# Patient Record
Sex: Male | Born: 1965 | Race: White | Hispanic: No | Smoking: Current every day smoker
Health system: Southern US, Community
[De-identification: ages and names within clinical notes are randomized; demographics above are authoritative.]

## PROBLEM LIST (undated history)

## (undated) DIAGNOSIS — Z950 Presence of cardiac pacemaker: Secondary | ICD-10-CM

## (undated) DIAGNOSIS — K861 Other chronic pancreatitis: Secondary | ICD-10-CM

## (undated) DIAGNOSIS — K7460 Unspecified cirrhosis of liver: Secondary | ICD-10-CM

---

## 2008-11-05 ENCOUNTER — Emergency Department (HOSPITAL_COMMUNITY): Admission: EM | Admit: 2008-11-05 | Discharge: 2008-11-06 | Payer: Self-pay | Admitting: Emergency Medicine

## 2008-12-08 ENCOUNTER — Emergency Department (HOSPITAL_COMMUNITY): Admission: EM | Admit: 2008-12-08 | Discharge: 2008-12-09 | Payer: Self-pay | Admitting: Emergency Medicine

## 2009-01-15 ENCOUNTER — Emergency Department (HOSPITAL_COMMUNITY): Admission: EM | Admit: 2009-01-15 | Discharge: 2009-01-15 | Payer: Self-pay | Admitting: Emergency Medicine

## 2009-01-17 ENCOUNTER — Inpatient Hospital Stay (HOSPITAL_COMMUNITY): Admission: EM | Admit: 2009-01-17 | Discharge: 2009-01-18 | Payer: Self-pay | Admitting: Emergency Medicine

## 2009-01-25 ENCOUNTER — Inpatient Hospital Stay (HOSPITAL_COMMUNITY): Admission: EM | Admit: 2009-01-25 | Discharge: 2009-01-30 | Payer: Self-pay | Admitting: Emergency Medicine

## 2009-01-26 ENCOUNTER — Ambulatory Visit: Payer: Self-pay | Admitting: Internal Medicine

## 2009-02-01 ENCOUNTER — Inpatient Hospital Stay (HOSPITAL_COMMUNITY): Admission: EM | Admit: 2009-02-01 | Discharge: 2009-02-03 | Payer: Self-pay | Admitting: Emergency Medicine

## 2009-03-01 ENCOUNTER — Encounter (INDEPENDENT_AMBULATORY_CARE_PROVIDER_SITE_OTHER): Payer: Self-pay | Admitting: Internal Medicine

## 2009-03-01 ENCOUNTER — Ambulatory Visit: Payer: Self-pay | Admitting: Family Medicine

## 2009-03-01 LAB — CONVERTED CEMR LAB
Albumin: 4.3 g/dL (ref 3.5–5.2)
BUN: 13 mg/dL (ref 6–23)
Basophils Absolute: 0.1 10*3/uL (ref 0.0–0.1)
Calcium: 8.6 mg/dL (ref 8.4–10.5)
Chloride: 108 meq/L (ref 96–112)
Creatinine, Ser: 0.99 mg/dL (ref 0.40–1.50)
Eosinophils Absolute: 1.4 10*3/uL — ABNORMAL HIGH (ref 0.0–0.7)
Eosinophils Relative: 16 % — ABNORMAL HIGH (ref 0–5)
Glucose, Bld: 88 mg/dL (ref 70–99)
HCT: 50.3 % (ref 39.0–52.0)
Hemoglobin: 17.1 g/dL — ABNORMAL HIGH (ref 13.0–17.0)
Lymphocytes Relative: 36 % (ref 12–46)
Lymphs Abs: 3.2 10*3/uL (ref 0.7–4.0)
MCV: 94.4 fL (ref 78.0–100.0)
Monocytes Absolute: 0.7 10*3/uL (ref 0.1–1.0)
Platelets: 129 10*3/uL — ABNORMAL LOW (ref 150–400)
Potassium: 4 meq/L (ref 3.5–5.3)
RDW: 14.9 % (ref 11.5–15.5)

## 2009-03-12 ENCOUNTER — Emergency Department (HOSPITAL_COMMUNITY): Admission: EM | Admit: 2009-03-12 | Discharge: 2009-03-13 | Payer: Self-pay | Admitting: Emergency Medicine

## 2009-03-20 ENCOUNTER — Inpatient Hospital Stay (HOSPITAL_COMMUNITY): Admission: EM | Admit: 2009-03-20 | Discharge: 2009-03-22 | Payer: Self-pay | Admitting: Emergency Medicine

## 2009-03-24 ENCOUNTER — Emergency Department (HOSPITAL_COMMUNITY): Admission: EM | Admit: 2009-03-24 | Discharge: 2009-03-24 | Payer: Self-pay | Admitting: Emergency Medicine

## 2009-03-31 ENCOUNTER — Emergency Department (HOSPITAL_COMMUNITY): Admission: EM | Admit: 2009-03-31 | Discharge: 2009-03-31 | Payer: Self-pay | Admitting: Emergency Medicine

## 2009-04-23 ENCOUNTER — Emergency Department (HOSPITAL_COMMUNITY): Admission: EM | Admit: 2009-04-23 | Discharge: 2009-04-23 | Payer: Self-pay | Admitting: Emergency Medicine

## 2009-05-03 ENCOUNTER — Ambulatory Visit: Payer: Self-pay | Admitting: Internal Medicine

## 2009-05-03 ENCOUNTER — Observation Stay (HOSPITAL_COMMUNITY): Admission: EM | Admit: 2009-05-03 | Discharge: 2009-05-03 | Payer: Self-pay | Admitting: Emergency Medicine

## 2009-05-08 ENCOUNTER — Emergency Department (HOSPITAL_COMMUNITY): Admission: EM | Admit: 2009-05-08 | Discharge: 2009-05-08 | Payer: Self-pay | Admitting: Emergency Medicine

## 2009-07-15 ENCOUNTER — Emergency Department (HOSPITAL_COMMUNITY): Admission: EM | Admit: 2009-07-15 | Discharge: 2009-07-15 | Payer: Self-pay | Admitting: Emergency Medicine

## 2010-01-23 ENCOUNTER — Emergency Department (HOSPITAL_COMMUNITY): Admission: EM | Admit: 2010-01-23 | Discharge: 2010-01-23 | Payer: Self-pay | Admitting: Emergency Medicine

## 2010-06-03 LAB — CBC
HCT: 42.7 % (ref 39.0–52.0)
MCH: 32.1 pg (ref 26.0–34.0)
MCV: 93.2 fL (ref 78.0–100.0)
Platelets: 71 10*3/uL — ABNORMAL LOW (ref 150–400)
RBC: 4.58 MIL/uL (ref 4.22–5.81)
WBC: 5.8 10*3/uL (ref 4.0–10.5)

## 2010-06-03 LAB — LIPASE, BLOOD: Lipase: 30 U/L (ref 11–59)

## 2010-06-03 LAB — POCT I-STAT, CHEM 8
Calcium, Ion: 1.12 mmol/L (ref 1.12–1.32)
Glucose, Bld: 101 mg/dL — ABNORMAL HIGH (ref 70–99)
HCT: 47 % (ref 39.0–52.0)
TCO2: 24 mmol/L (ref 0–100)

## 2010-06-03 LAB — COMPREHENSIVE METABOLIC PANEL
Albumin: 3.8 g/dL (ref 3.5–5.2)
Alkaline Phosphatase: 81 U/L (ref 39–117)
BUN: 10 mg/dL (ref 6–23)
Chloride: 107 mEq/L (ref 96–112)
Creatinine, Ser: 0.99 mg/dL (ref 0.4–1.5)
GFR calc non Af Amer: >60 mL/min (ref >60)
Glucose, Bld: 102 mg/dL — ABNORMAL HIGH (ref 70–99)
Potassium: 3.7 mEq/L (ref 3.5–5.1)
Total Bilirubin: 0.9 mg/dL (ref 0.3–1.2)

## 2010-06-03 LAB — DIFFERENTIAL
Basophils Absolute: 0 10*3/uL (ref 0.0–0.1)
Basophils Relative: 1 % (ref 0–1)
Lymphocytes Relative: 38 % (ref 12–46)
Monocytes Absolute: 0.4 10*3/uL (ref 0.1–1.0)
Neutro Abs: 2.7 10*3/uL (ref 1.7–7.7)
Neutrophils Relative %: 47 % (ref 43–77)

## 2010-06-03 LAB — URINALYSIS, ROUTINE W REFLEX MICROSCOPIC
Ketones, ur: NEGATIVE mg/dL
Leukocytes, UA: NEGATIVE
Protein, ur: NEGATIVE mg/dL
Urobilinogen, UA: 1 mg/dL (ref 0.0–1.0)

## 2010-06-03 LAB — URINE MICROSCOPIC-ADD ON

## 2010-06-10 LAB — POCT I-STAT, CHEM 8
Creatinine, Ser: 0.7 mg/dL (ref 0.4–1.5)
Hemoglobin: 18.4 g/dL — ABNORMAL HIGH (ref 13.0–17.0)
Potassium: 3.8 mEq/L (ref 3.5–5.1)
Sodium: 142 mEq/L (ref 135–145)
TCO2: 23 mmol/L (ref 0–100)

## 2010-06-10 LAB — DIFFERENTIAL
Basophils Absolute: 0 10*3/uL (ref 0.0–0.1)
Eosinophils Absolute: 0.3 10*3/uL (ref 0.0–0.7)
Eosinophils Relative: 4 % (ref 0–5)
Lymphocytes Relative: 25 % (ref 12–46)
Lymphs Abs: 1.7 10*3/uL (ref 0.7–4.0)
Neutrophils Relative %: 64 % (ref 43–77)

## 2010-06-10 LAB — POCT CARDIAC MARKERS
CKMB, poc: <1 ng/mL — ABNORMAL LOW (ref 1.0–8.0)
Myoglobin, poc: 38 ng/mL (ref 12–200)

## 2010-06-10 LAB — CBC
HCT: 47.2 % (ref 39.0–52.0)
MCV: 95.9 fL (ref 78.0–100.0)
Platelets: 110 10*3/uL — ABNORMAL LOW (ref 150–400)
RDW: 14.4 % (ref 11.5–15.5)
WBC: 6.8 10*3/uL (ref 4.0–10.5)

## 2010-06-11 LAB — RAPID URINE DRUG SCREEN, HOSP PERFORMED
Opiates: NOT DETECTED
Tetrahydrocannabinol: NOT DETECTED

## 2010-06-11 LAB — CBC
MCHC: 34.6 g/dL (ref 30.0–36.0)
Platelets: 109 10*3/uL — ABNORMAL LOW (ref 150–400)
RDW: 13.6 % (ref 11.5–15.5)

## 2010-06-11 LAB — BASIC METABOLIC PANEL
BUN: 13 mg/dL (ref 6–23)
CO2: 23 mEq/L (ref 19–32)
Calcium: 9.9 mg/dL (ref 8.4–10.5)
Creatinine, Ser: 0.9 mg/dL (ref 0.4–1.5)
GFR calc non Af Amer: >60 mL/min (ref >60)
Glucose, Bld: 88 mg/dL (ref 70–99)

## 2010-06-11 LAB — URINALYSIS, ROUTINE W REFLEX MICROSCOPIC
Bilirubin Urine: NEGATIVE
Glucose, UA: NEGATIVE mg/dL
Nitrite: NEGATIVE
Specific Gravity, Urine: 1.014 (ref 1.005–1.030)
pH: 6 (ref 5.0–8.0)

## 2010-06-11 LAB — HEPATITIS C VIRUS, RIBA
HCV C22P Band: 4
Hepatitis C-RIBA II; NS5 Band: 4

## 2010-06-11 LAB — C-REACTIVE PROTEIN: CRP: 0 mg/dL — ABNORMAL LOW (ref <0.6)

## 2010-06-11 LAB — SEDIMENTATION RATE: Sed Rate: 1 mm/hr (ref 0–16)

## 2010-06-11 LAB — DIFFERENTIAL
Basophils Absolute: 0 10*3/uL (ref 0.0–0.1)
Basophils Relative: 1 % (ref 0–1)
Lymphocytes Relative: 43 % (ref 12–46)
Monocytes Absolute: 0.6 10*3/uL (ref 0.1–1.0)
Neutro Abs: 2.4 10*3/uL (ref 1.7–7.7)
Neutrophils Relative %: 37 % — ABNORMAL LOW (ref 43–77)

## 2010-06-11 LAB — LACTIC ACID, PLASMA: Lactic Acid, Venous: 1.3 mmol/L (ref 0.5–2.2)

## 2010-06-11 LAB — HIV ANTIBODY (ROUTINE TESTING W REFLEX): HIV: NONREACTIVE

## 2010-06-11 LAB — HEPATIC FUNCTION PANEL
Alkaline Phosphatase: 70 U/L (ref 39–117)
Indirect Bilirubin: 0.6 mg/dL (ref 0.3–0.9)
Total Bilirubin: 0.9 mg/dL (ref 0.3–1.2)
Total Protein: 6 g/dL (ref 6.0–8.3)

## 2010-06-11 LAB — HEPATITIS B CORE ANTIBODY, IGM: Hep B C IgM: NEGATIVE

## 2010-06-23 LAB — DIFFERENTIAL
Basophils Relative: 1 % (ref 0–1)
Basophils Relative: 1 % (ref 0–1)
Eosinophils Absolute: 0.5 10*3/uL (ref 0.0–0.7)
Eosinophils Absolute: 0.7 10*3/uL (ref 0.0–0.7)
Eosinophils Relative: 10 % — ABNORMAL HIGH (ref 0–5)
Eosinophils Relative: 9 % — ABNORMAL HIGH (ref 0–5)
Lymphocytes Relative: 27 % (ref 12–46)
Lymphocytes Relative: 35 % (ref 12–46)
Lymphs Abs: 1.2 10*3/uL (ref 0.7–4.0)
Lymphs Abs: 1.8 10*3/uL (ref 0.7–4.0)
Lymphs Abs: 2 10*3/uL (ref 0.7–4.0)
Lymphs Abs: 3.4 10*3/uL (ref 0.7–4.0)
Monocytes Absolute: 0.5 10*3/uL (ref 0.1–1.0)
Monocytes Relative: 10 % (ref 3–12)
Monocytes Relative: 9 % (ref 3–12)
Neutro Abs: 2.2 10*3/uL (ref 1.7–7.7)
Neutro Abs: 3.3 10*3/uL (ref 1.7–7.7)
Neutrophils Relative %: 40 % — ABNORMAL LOW (ref 43–77)
Neutrophils Relative %: 50 % (ref 43–77)
Neutrophils Relative %: 50 % (ref 43–77)

## 2010-06-23 LAB — CBC
HCT: 43 % (ref 39.0–52.0)
HCT: 43.3 % (ref 39.0–52.0)
HCT: 48 % (ref 39.0–52.0)
Hemoglobin: 14.8 g/dL (ref 13.0–17.0)
Hemoglobin: 14.9 g/dL (ref 13.0–17.0)
Hemoglobin: 16.1 g/dL (ref 13.0–17.0)
Hemoglobin: 16.6 g/dL (ref 13.0–17.0)
MCHC: 34 g/dL (ref 30.0–36.0)
MCHC: 34.2 g/dL (ref 30.0–36.0)
MCHC: 34.3 g/dL (ref 30.0–36.0)
Platelets: 101 10*3/uL — ABNORMAL LOW (ref 150–400)
Platelets: 129 10*3/uL — ABNORMAL LOW (ref 150–400)
Platelets: 88 10*3/uL — ABNORMAL LOW (ref 150–400)
RBC: 4.51 MIL/uL (ref 4.22–5.81)
RBC: 4.92 MIL/uL (ref 4.22–5.81)
RBC: 5 MIL/uL (ref 4.22–5.81)
RBC: 5.21 MIL/uL (ref 4.22–5.81)
RDW: 14.2 % (ref 11.5–15.5)
WBC: 5.5 10*3/uL (ref 4.0–10.5)
WBC: 5.8 10*3/uL (ref 4.0–10.5)
WBC: 6.4 10*3/uL (ref 4.0–10.5)
WBC: 8.4 10*3/uL (ref 4.0–10.5)

## 2010-06-23 LAB — COMPREHENSIVE METABOLIC PANEL
ALT: 209 U/L — ABNORMAL HIGH (ref 0–53)
AST: 118 U/L — ABNORMAL HIGH (ref 0–37)
Albumin: 3.5 g/dL (ref 3.5–5.2)
Albumin: 4 g/dL (ref 3.5–5.2)
Alkaline Phosphatase: 75 U/L (ref 39–117)
Alkaline Phosphatase: 79 U/L (ref 39–117)
BUN: 11 mg/dL (ref 6–23)
BUN: 9 mg/dL (ref 6–23)
CO2: 23 mEq/L (ref 19–32)
CO2: 27 mEq/L (ref 19–32)
Calcium: 8.6 mg/dL (ref 8.4–10.5)
Calcium: 8.7 mg/dL (ref 8.4–10.5)
Chloride: 102 mEq/L (ref 96–112)
Chloride: 109 mEq/L (ref 96–112)
GFR calc Af Amer: >60 mL/min (ref >60)
GFR calc non Af Amer: >60 mL/min (ref >60)
Glucose, Bld: 102 mg/dL — ABNORMAL HIGH (ref 70–99)
Glucose, Bld: 103 mg/dL — ABNORMAL HIGH (ref 70–99)
Potassium: 3.7 mEq/L (ref 3.5–5.1)
Potassium: 3.8 mEq/L (ref 3.5–5.1)
Sodium: 139 mEq/L (ref 135–145)
Total Bilirubin: 0.8 mg/dL (ref 0.3–1.2)
Total Protein: 6 g/dL (ref 6.0–8.3)

## 2010-06-23 LAB — ETHANOL
Alcohol, Ethyl (B): <5 mg/dL (ref 0–10)
Alcohol, Ethyl (B): <5 mg/dL (ref 0–10)

## 2010-06-23 LAB — DRUGS OF ABUSE SCREEN W/O ALC, ROUTINE URINE
Amphetamine Screen, Ur: NEGATIVE
Benzodiazepines.: NEGATIVE
Creatinine,U: 134.6 mg/dL
Marijuana Metabolite: NEGATIVE
Opiate Screen, Urine: NEGATIVE
Propoxyphene: NEGATIVE

## 2010-06-23 LAB — BASIC METABOLIC PANEL
GFR calc non Af Amer: >60 mL/min (ref >60)
Potassium: 3.6 mEq/L (ref 3.5–5.1)
Sodium: 135 mEq/L (ref 135–145)

## 2010-06-23 LAB — PHOSPHORUS: Phosphorus: 4.7 mg/dL — ABNORMAL HIGH (ref 2.3–4.6)

## 2010-06-23 LAB — POCT I-STAT, CHEM 8
Chloride: 108 mEq/L (ref 96–112)
Glucose, Bld: 92 mg/dL (ref 70–99)
HCT: 55 % — ABNORMAL HIGH (ref 39.0–52.0)
Hemoglobin: 18.7 g/dL — ABNORMAL HIGH (ref 13.0–17.0)
Potassium: 3.8 mEq/L (ref 3.5–5.1)
Sodium: 142 mEq/L (ref 135–145)

## 2010-06-23 LAB — POCT CARDIAC MARKERS
CKMB, poc: <1 ng/mL — ABNORMAL LOW (ref 1.0–8.0)
Myoglobin, poc: 77.4 ng/mL (ref 12–200)

## 2010-06-23 LAB — RAPID URINE DRUG SCREEN, HOSP PERFORMED
Amphetamines: POSITIVE — AB
Barbiturates: NOT DETECTED
Benzodiazepines: NOT DETECTED

## 2010-06-23 LAB — CULTURE, BLOOD (ROUTINE X 2)
Culture: NO GROWTH
Culture: NO GROWTH

## 2010-06-23 LAB — HEPATIC FUNCTION PANEL
AST: 121 U/L — ABNORMAL HIGH (ref 0–37)
Albumin: 3.8 g/dL (ref 3.5–5.2)

## 2010-06-23 LAB — APTT: aPTT: 30 seconds (ref 24–37)

## 2010-06-23 LAB — MAGNESIUM: Magnesium: 2 mg/dL (ref 1.5–2.5)

## 2010-06-25 LAB — COMPREHENSIVE METABOLIC PANEL
ALT: 320 U/L — ABNORMAL HIGH (ref 0–53)
AST: 128 U/L — ABNORMAL HIGH (ref 0–37)
AST: 151 U/L — ABNORMAL HIGH (ref 0–37)
Albumin: 3.4 g/dL — ABNORMAL LOW (ref 3.5–5.2)
Albumin: 3.9 g/dL (ref 3.5–5.2)
Albumin: 4.1 g/dL (ref 3.5–5.2)
BUN: 11 mg/dL (ref 6–23)
BUN: 12 mg/dL (ref 6–23)
BUN: 6 mg/dL (ref 6–23)
CO2: 23 mEq/L (ref 19–32)
CO2: 27 mEq/L (ref 19–32)
CO2: 28 mEq/L (ref 19–32)
Calcium: 9.1 mg/dL (ref 8.4–10.5)
Calcium: 9.2 mg/dL (ref 8.4–10.5)
Chloride: 102 mEq/L (ref 96–112)
Chloride: 109 mEq/L (ref 96–112)
Creatinine, Ser: 1.04 mg/dL (ref 0.4–1.5)
Creatinine, Ser: 1.06 mg/dL (ref 0.4–1.5)
Creatinine, Ser: 1.16 mg/dL (ref 0.4–1.5)
GFR calc Af Amer: >60 mL/min (ref >60)
GFR calc Af Amer: >60 mL/min (ref >60)
GFR calc Af Amer: >60 mL/min (ref >60)
GFR calc non Af Amer: >60 mL/min (ref >60)
GFR calc non Af Amer: >60 mL/min (ref >60)
GFR calc non Af Amer: >60 mL/min (ref >60)
Glucose, Bld: 103 mg/dL — ABNORMAL HIGH (ref 70–99)
Potassium: 3.5 mEq/L (ref 3.5–5.1)
Sodium: 137 mEq/L (ref 135–145)
Total Bilirubin: 0.6 mg/dL (ref 0.3–1.2)
Total Bilirubin: 1 mg/dL (ref 0.3–1.2)
Total Protein: 6.3 g/dL (ref 6.0–8.3)
Total Protein: 7.3 g/dL (ref 6.0–8.3)

## 2010-06-25 LAB — BASIC METABOLIC PANEL
BUN: 13 mg/dL (ref 6–23)
BUN: 8 mg/dL (ref 6–23)
CO2: 30 mEq/L (ref 19–32)
Calcium: 8.8 mg/dL (ref 8.4–10.5)
Creatinine, Ser: 1.22 mg/dL (ref 0.4–1.5)
GFR calc non Af Amer: >60 mL/min (ref >60)
Glucose, Bld: 80 mg/dL (ref 70–99)
Potassium: 4 mEq/L (ref 3.5–5.1)
Sodium: 138 mEq/L (ref 135–145)

## 2010-06-25 LAB — CBC
HCT: 42.1 % (ref 39.0–52.0)
HCT: 44.2 % (ref 39.0–52.0)
MCHC: 33.7 g/dL (ref 30.0–36.0)
MCHC: 34.2 g/dL (ref 30.0–36.0)
MCHC: 34.4 g/dL (ref 30.0–36.0)
MCHC: 34.8 g/dL (ref 30.0–36.0)
MCHC: 34.9 g/dL (ref 30.0–36.0)
MCV: 95.8 fL (ref 78.0–100.0)
MCV: 96.6 fL (ref 78.0–100.0)
MCV: 97.7 fL (ref 78.0–100.0)
Platelets: 121 10*3/uL — ABNORMAL LOW (ref 150–400)
Platelets: 124 10*3/uL — ABNORMAL LOW (ref 150–400)
Platelets: 140 10*3/uL — ABNORMAL LOW (ref 150–400)
RBC: 4.57 MIL/uL (ref 4.22–5.81)
RBC: 4.71 MIL/uL (ref 4.22–5.81)
RBC: 4.75 MIL/uL (ref 4.22–5.81)
RDW: 14.2 % (ref 11.5–15.5)
WBC: 5 10*3/uL (ref 4.0–10.5)
WBC: 5.4 10*3/uL (ref 4.0–10.5)
WBC: 5.6 10*3/uL (ref 4.0–10.5)
WBC: 8.7 10*3/uL (ref 4.0–10.5)

## 2010-06-25 LAB — DIFFERENTIAL
Eosinophils Absolute: 0.4 10*3/uL (ref 0.0–0.7)
Eosinophils Relative: 11 % — ABNORMAL HIGH (ref 0–5)
Eosinophils Relative: 3 % (ref 0–5)
Lymphocytes Relative: 45 % (ref 12–46)
Lymphs Abs: 2.4 10*3/uL (ref 0.7–4.0)
Monocytes Absolute: 0.5 10*3/uL (ref 0.1–1.0)
Monocytes Absolute: 0.9 10*3/uL (ref 0.1–1.0)
Monocytes Relative: 10 % (ref 3–12)
Monocytes Relative: 8 % (ref 3–12)
Neutro Abs: 1.8 10*3/uL (ref 1.7–7.7)

## 2010-06-25 LAB — URINE CULTURE
Colony Count: NO GROWTH
Culture: NO GROWTH

## 2010-06-25 LAB — URINALYSIS, ROUTINE W REFLEX MICROSCOPIC
Glucose, UA: NEGATIVE mg/dL
Hgb urine dipstick: NEGATIVE
Ketones, ur: NEGATIVE mg/dL
Nitrite: NEGATIVE
Protein, ur: NEGATIVE mg/dL
Protein, ur: NEGATIVE mg/dL
Urobilinogen, UA: >8 mg/dL — ABNORMAL HIGH (ref 0.0–1.0)
pH: 6.5 (ref 5.0–8.0)

## 2010-06-25 LAB — CULTURE, BLOOD (ROUTINE X 2)
Culture: NO GROWTH
Culture: NO GROWTH

## 2010-06-25 LAB — HEPATITIS PANEL, ACUTE
HCV Ab: REACTIVE — AB
Hepatitis B Surface Ag: NEGATIVE

## 2010-06-25 LAB — URINALYSIS, MICROSCOPIC ONLY
Bilirubin Urine: NEGATIVE
Hgb urine dipstick: NEGATIVE
Ketones, ur: NEGATIVE mg/dL
Nitrite: NEGATIVE
Urobilinogen, UA: 4 mg/dL — ABNORMAL HIGH (ref 0.0–1.0)

## 2010-06-25 LAB — URINE MICROSCOPIC-ADD ON

## 2010-06-25 LAB — LEGIONELLA ANTIGEN, URINE

## 2010-06-25 LAB — RAPID URINE DRUG SCREEN, HOSP PERFORMED
Amphetamines: NOT DETECTED
Opiates: POSITIVE — AB
Tetrahydrocannabinol: POSITIVE — AB

## 2010-06-25 LAB — HEPATITIS C ANTIBODY: HCV Ab: REACTIVE — AB

## 2010-06-25 LAB — POCT CARDIAC MARKERS
CKMB, poc: 1.5 ng/mL (ref 1.0–8.0)
Myoglobin, poc: 80.5 ng/mL (ref 12–200)
Troponin i, poc: <0.05 ng/mL (ref 0.00–0.09)

## 2010-06-25 LAB — GLUCOSE, CAPILLARY
Glucose-Capillary: 118 mg/dL — ABNORMAL HIGH (ref 70–99)
Glucose-Capillary: 97 mg/dL (ref 70–99)

## 2010-06-25 LAB — HEPATITIS B SURFACE ANTIBODY,QUALITATIVE: Hep B S Ab: POSITIVE — AB

## 2010-06-26 LAB — HEPATIC FUNCTION PANEL
ALT: 461 U/L — ABNORMAL HIGH (ref 0–53)
Albumin: 3.8 g/dL (ref 3.5–5.2)
Alkaline Phosphatase: 88 U/L (ref 39–117)
Indirect Bilirubin: 0.9 mg/dL (ref 0.3–0.9)
Total Protein: 6.3 g/dL (ref 6.0–8.3)

## 2010-06-26 LAB — CBC
HCT: 41.8 % (ref 39.0–52.0)
HCT: 48.4 % (ref 39.0–52.0)
Hemoglobin: 15.9 g/dL (ref 13.0–17.0)
Hemoglobin: 16.5 g/dL (ref 13.0–17.0)
Platelets: 96 10*3/uL — ABNORMAL LOW (ref 150–400)
RBC: 4.8 MIL/uL (ref 4.22–5.81)
RBC: 5.02 MIL/uL (ref 4.22–5.81)
RDW: 14.4 % (ref 11.5–15.5)
RDW: 14.7 % (ref 11.5–15.5)
WBC: 6.6 10*3/uL (ref 4.0–10.5)

## 2010-06-26 LAB — COMPREHENSIVE METABOLIC PANEL
ALT: 505 U/L — ABNORMAL HIGH (ref 0–53)
ALT: 514 U/L — ABNORMAL HIGH (ref 0–53)
Albumin: 3.6 g/dL (ref 3.5–5.2)
Alkaline Phosphatase: 77 U/L (ref 39–117)
Alkaline Phosphatase: 87 U/L (ref 39–117)
Alkaline Phosphatase: 97 U/L (ref 39–117)
BUN: 8 mg/dL (ref 6–23)
BUN: 8 mg/dL (ref 6–23)
CO2: 28 mEq/L (ref 19–32)
CO2: 29 mEq/L (ref 19–32)
Chloride: 106 mEq/L (ref 96–112)
Chloride: 108 mEq/L (ref 96–112)
GFR calc non Af Amer: >60 mL/min (ref >60)
Glucose, Bld: 83 mg/dL (ref 70–99)
Glucose, Bld: 98 mg/dL (ref 70–99)
Potassium: 3.7 mEq/L (ref 3.5–5.1)
Potassium: 3.9 mEq/L (ref 3.5–5.1)
Potassium: 4.2 mEq/L (ref 3.5–5.1)
Sodium: 141 mEq/L (ref 135–145)
Sodium: 141 mEq/L (ref 135–145)
Total Bilirubin: 1 mg/dL (ref 0.3–1.2)
Total Bilirubin: 1.3 mg/dL — ABNORMAL HIGH (ref 0.3–1.2)
Total Protein: 5.9 g/dL — ABNORMAL LOW (ref 6.0–8.3)
Total Protein: 7.3 g/dL (ref 6.0–8.3)

## 2010-06-26 LAB — LACTIC ACID, PLASMA: Lactic Acid, Venous: 0.7 mmol/L (ref 0.5–2.2)

## 2010-06-26 LAB — DIFFERENTIAL
Basophils Absolute: 0 10*3/uL (ref 0.0–0.1)
Basophils Absolute: 0 10*3/uL (ref 0.0–0.1)
Basophils Relative: 1 % (ref 0–1)
Basophils Relative: 1 % (ref 0–1)
Eosinophils Absolute: 0.5 10*3/uL (ref 0.0–0.7)
Eosinophils Absolute: 0.5 10*3/uL (ref 0.0–0.7)
Monocytes Relative: 10 % (ref 3–12)
Monocytes Relative: 7 % (ref 3–12)
Neutrophils Relative %: 40 % — ABNORMAL LOW (ref 43–77)
Neutrophils Relative %: 42 % — ABNORMAL LOW (ref 43–77)

## 2010-06-26 LAB — URINALYSIS, ROUTINE W REFLEX MICROSCOPIC
Glucose, UA: NEGATIVE mg/dL
Hgb urine dipstick: NEGATIVE
Ketones, ur: NEGATIVE mg/dL
Protein, ur: NEGATIVE mg/dL
Urobilinogen, UA: 2 mg/dL — ABNORMAL HIGH (ref 0.0–1.0)

## 2010-06-26 LAB — URINE CULTURE: Culture: NO GROWTH

## 2010-06-26 LAB — LIPASE, BLOOD: Lipase: 45 U/L (ref 11–59)

## 2010-06-27 LAB — LIPASE, BLOOD: Lipase: 175 U/L — ABNORMAL HIGH (ref 11–59)

## 2010-06-27 LAB — CBC
HCT: 49.3 % (ref 39.0–52.0)
MCHC: 34.5 g/dL (ref 30.0–36.0)
MCV: 95 fL (ref 78.0–100.0)
Platelets: 133 10*3/uL — ABNORMAL LOW (ref 150–400)
RDW: 14.1 % (ref 11.5–15.5)

## 2010-06-27 LAB — COMPREHENSIVE METABOLIC PANEL
Albumin: 4 g/dL (ref 3.5–5.2)
BUN: 13 mg/dL (ref 6–23)
Creatinine, Ser: 1.01 mg/dL (ref 0.4–1.5)
Total Protein: 6.8 g/dL (ref 6.0–8.3)

## 2010-06-27 LAB — DIFFERENTIAL
Eosinophils Relative: 7 % — ABNORMAL HIGH (ref 0–5)
Lymphocytes Relative: 42 % (ref 12–46)
Lymphs Abs: 2.6 10*3/uL (ref 0.7–4.0)

## 2010-06-28 LAB — URINALYSIS, ROUTINE W REFLEX MICROSCOPIC
Ketones, ur: NEGATIVE mg/dL
Protein, ur: NEGATIVE mg/dL
Urobilinogen, UA: 1 mg/dL (ref 0.0–1.0)

## 2010-06-28 LAB — DIFFERENTIAL
Basophils Absolute: 0.1 10*3/uL (ref 0.0–0.1)
Basophils Relative: 1 % (ref 0–1)
Neutro Abs: 3.1 10*3/uL (ref 1.7–7.7)
Neutrophils Relative %: 45 % (ref 43–77)

## 2010-06-28 LAB — CBC
HCT: 48.7 % (ref 39.0–52.0)
Hemoglobin: 16.7 g/dL (ref 13.0–17.0)
RDW: 15.3 % (ref 11.5–15.5)

## 2010-06-28 LAB — COMPREHENSIVE METABOLIC PANEL
Alkaline Phosphatase: 75 U/L (ref 39–117)
BUN: 7 mg/dL (ref 6–23)
CO2: 27 mEq/L (ref 19–32)
Chloride: 105 mEq/L (ref 96–112)
GFR calc non Af Amer: >60 mL/min (ref >60)
Glucose, Bld: 90 mg/dL (ref 70–99)
Potassium: 4 mEq/L (ref 3.5–5.1)
Total Bilirubin: 0.5 mg/dL (ref 0.3–1.2)
Total Protein: 6.9 g/dL (ref 6.0–8.3)

## 2010-06-28 LAB — LIPASE, BLOOD: Lipase: 53 U/L (ref 11–59)

## 2010-09-16 IMAGING — CR DG CHEST 2V
2 series · 2 of 2 positions shown · non-contrast
Comparison: 01/25/2009 and earlier.

CLINICAL DATA: 43-year-old male with cough.  History of bronchitis,
cirrhosis.

CHEST - 2 VIEW

[w chest pa]
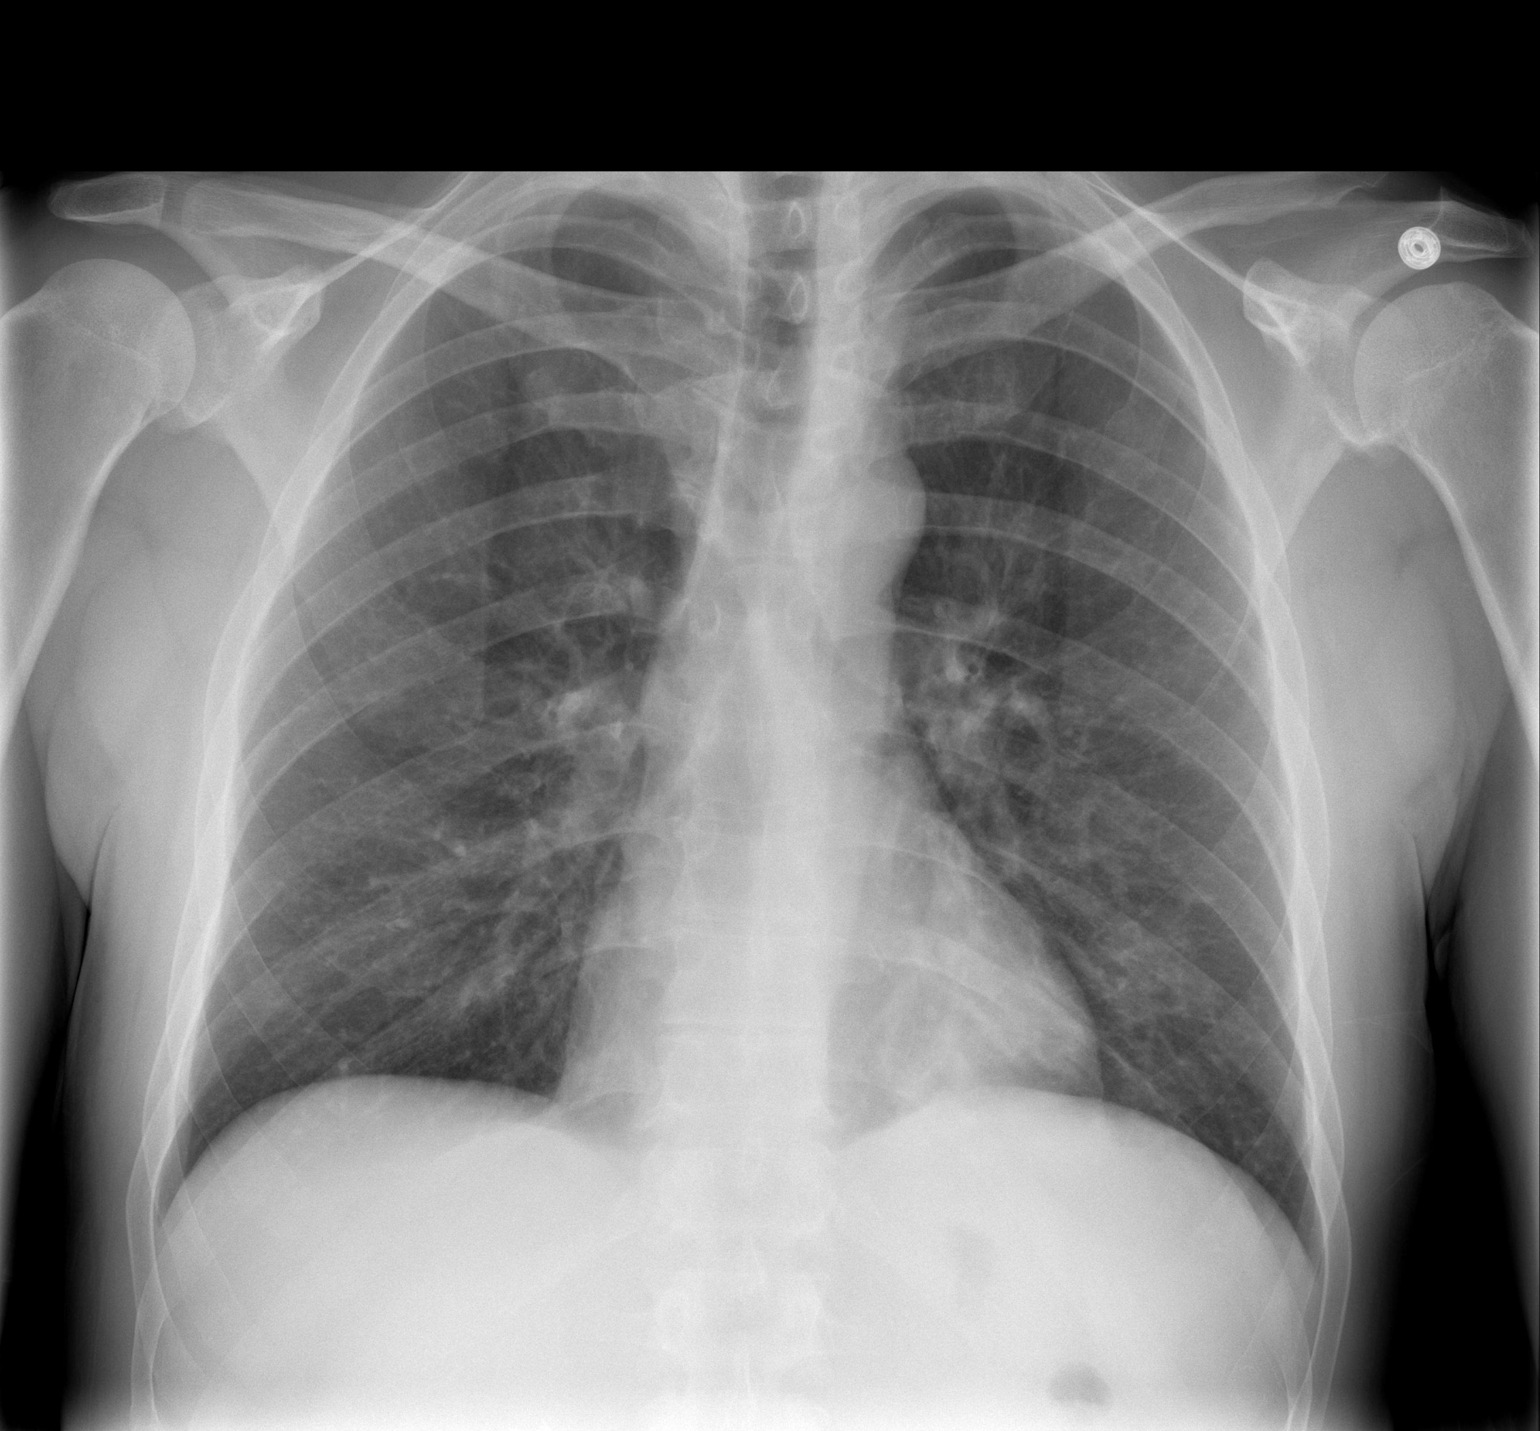

[w chest lat]
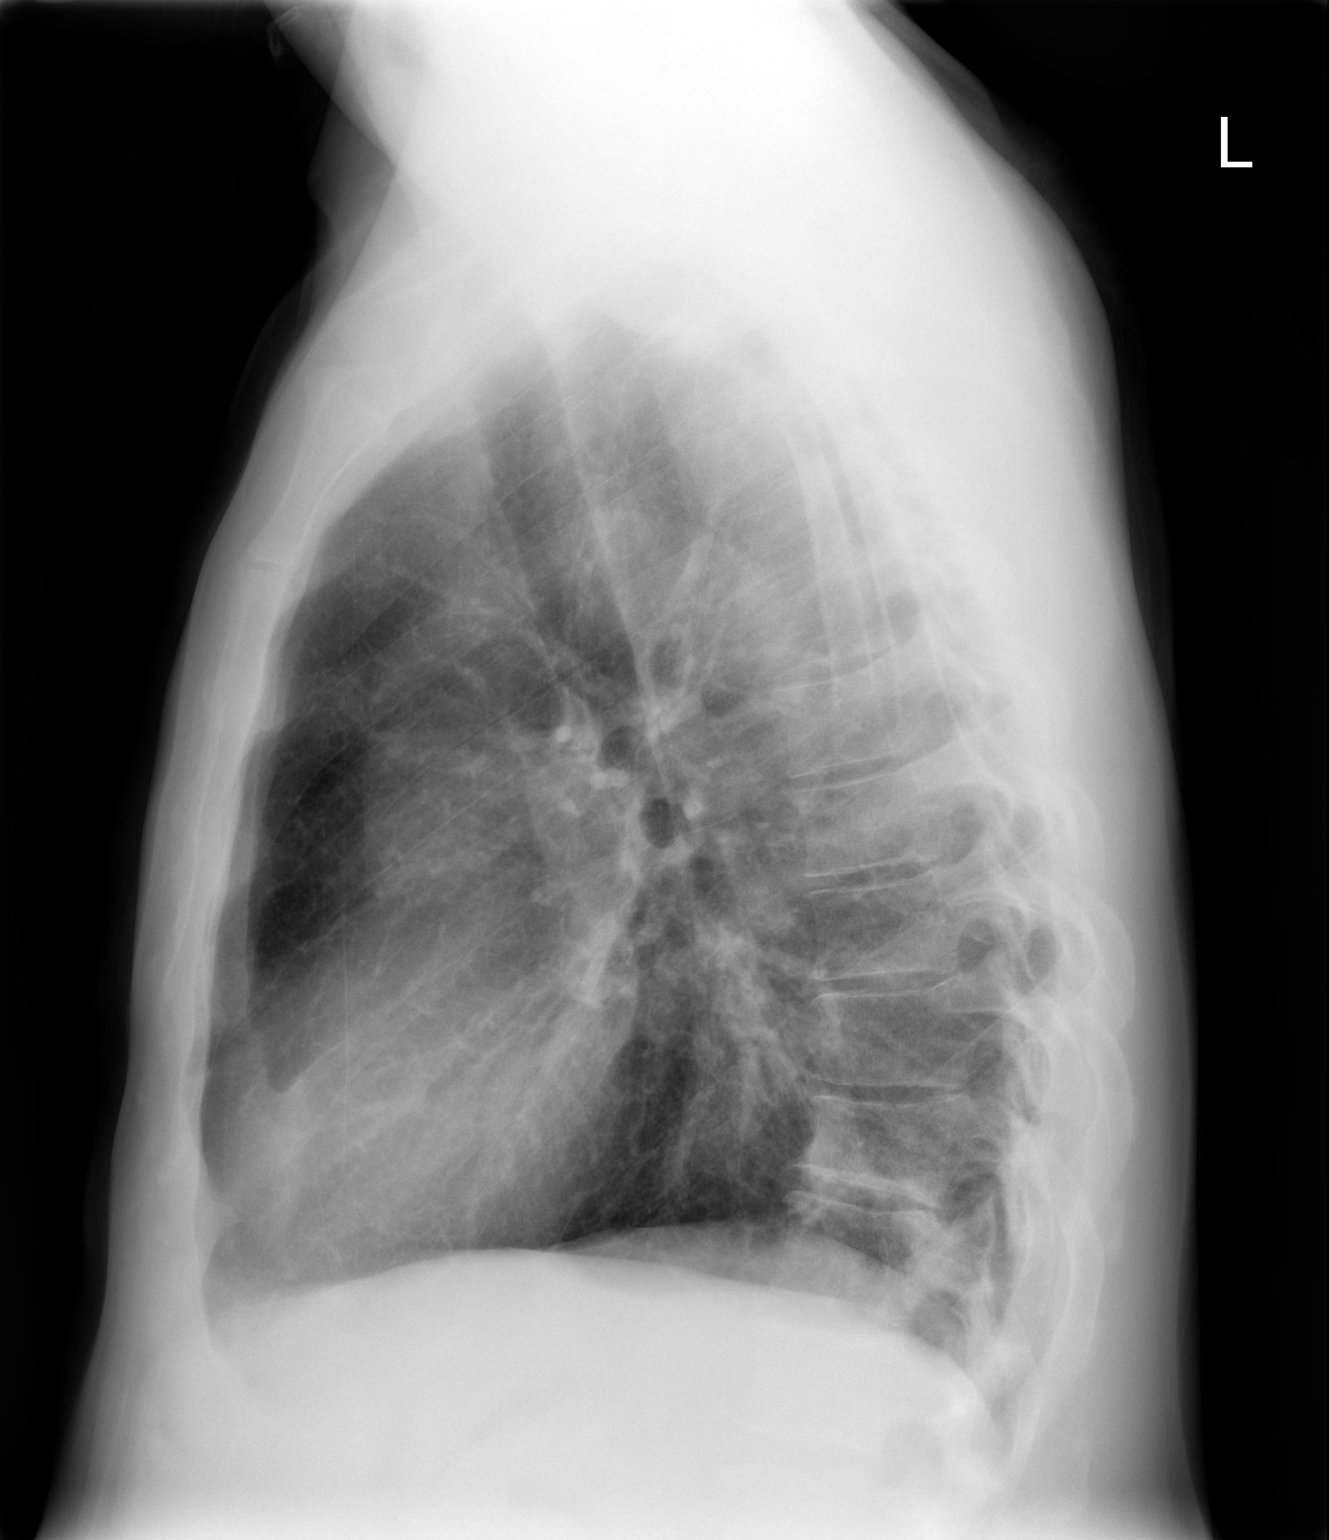

[2 of 2 positions shown; findings below may reference images not displayed]

FINDINGS: Increased retrocardiac opacity is not well delineated on
the lateral view.  No pleural effusion.  Stable cardiac size and
mediastinal contours.  No pneumothorax or pulmonary edema.  In the
right lung is clear. Is stable in wedging of a lower thoracic
vertebra.
IMPRESSION: Increased retrocardiac opacity is nonspecific. Developing infection
cannot be excluded.

## 2010-09-16 IMAGING — CR DG ABDOMEN 2V
2 series · 2 of 2 positions shown · non-contrast
Comparison: 01/25/2009

CLINICAL DATA: Abdominal pain and cough.

ABDOMEN - 2 VIEW

[w abdomen upright]
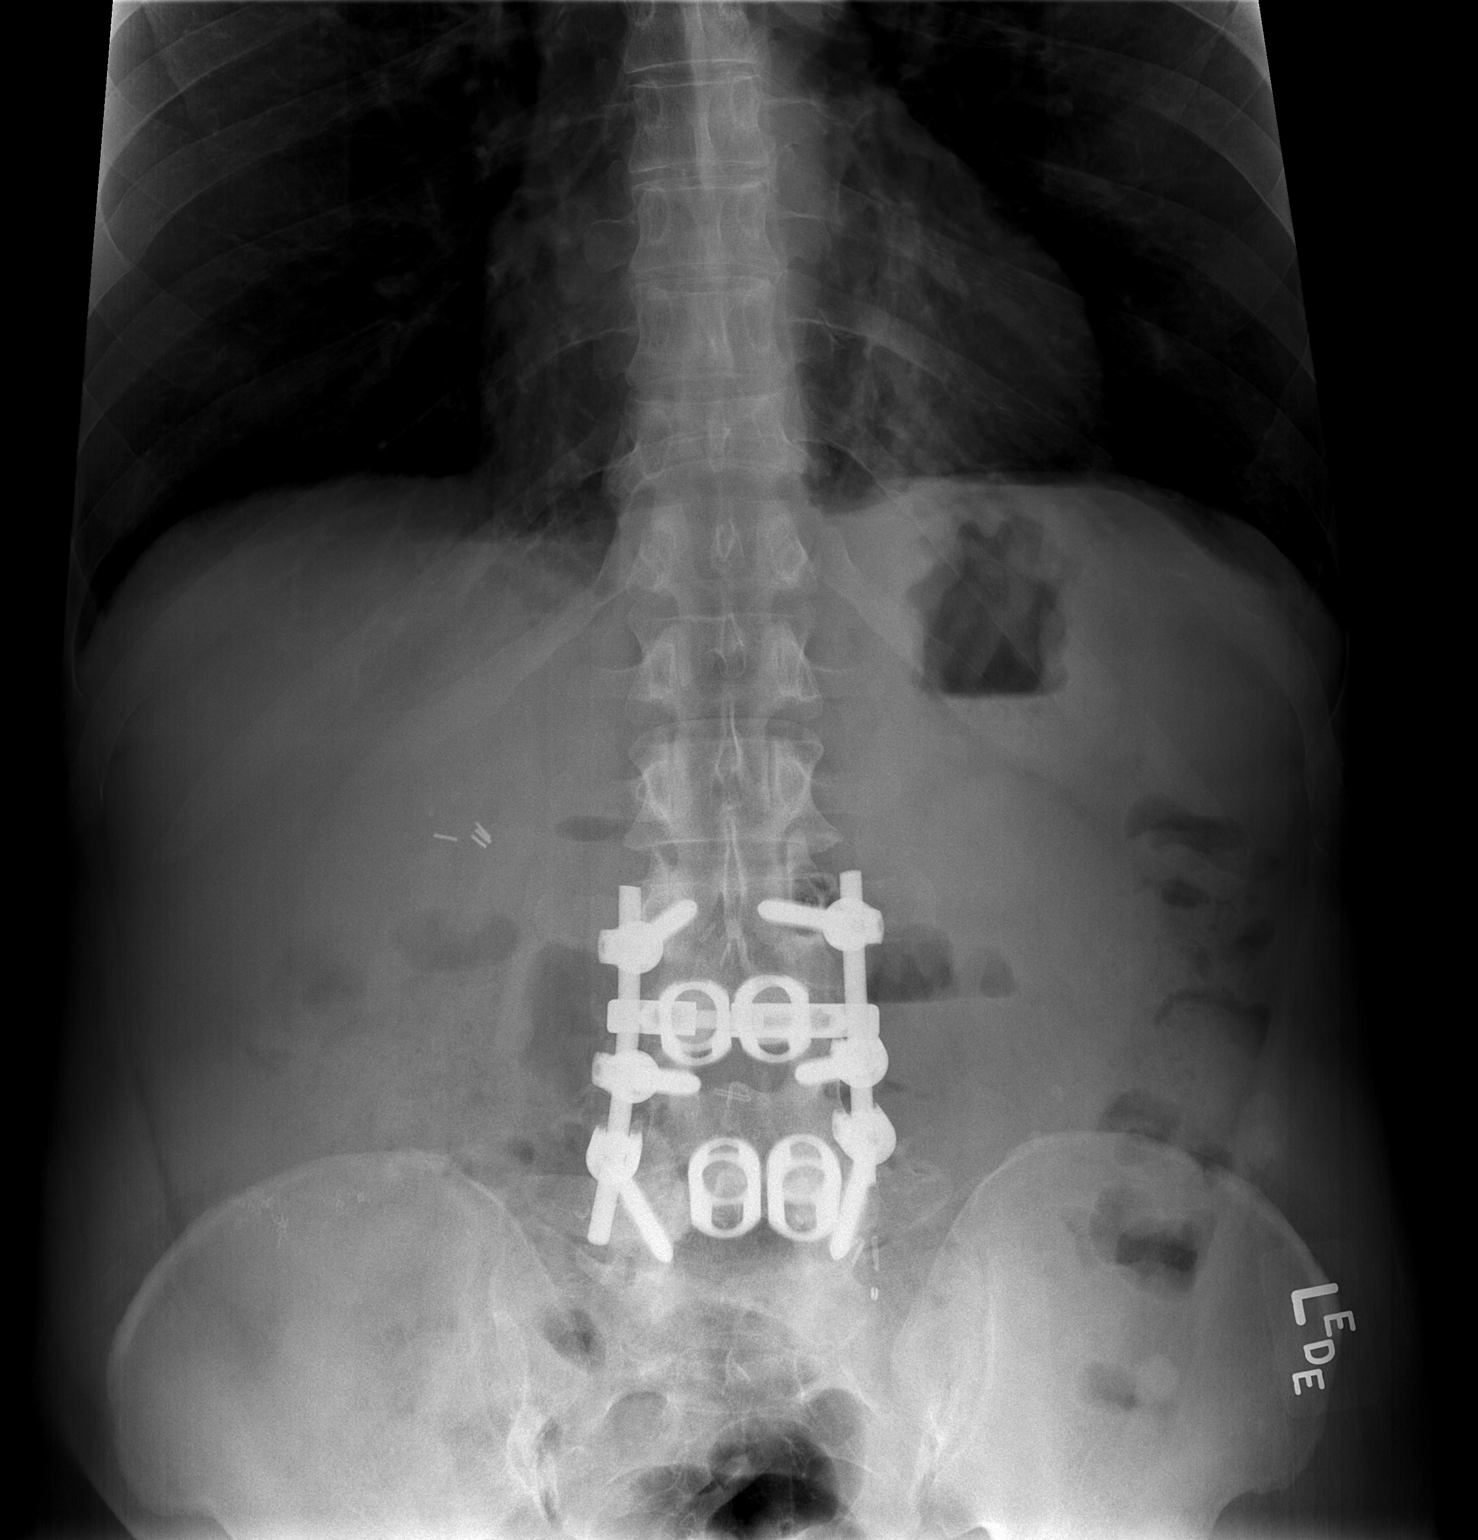

[t abdomen supine]
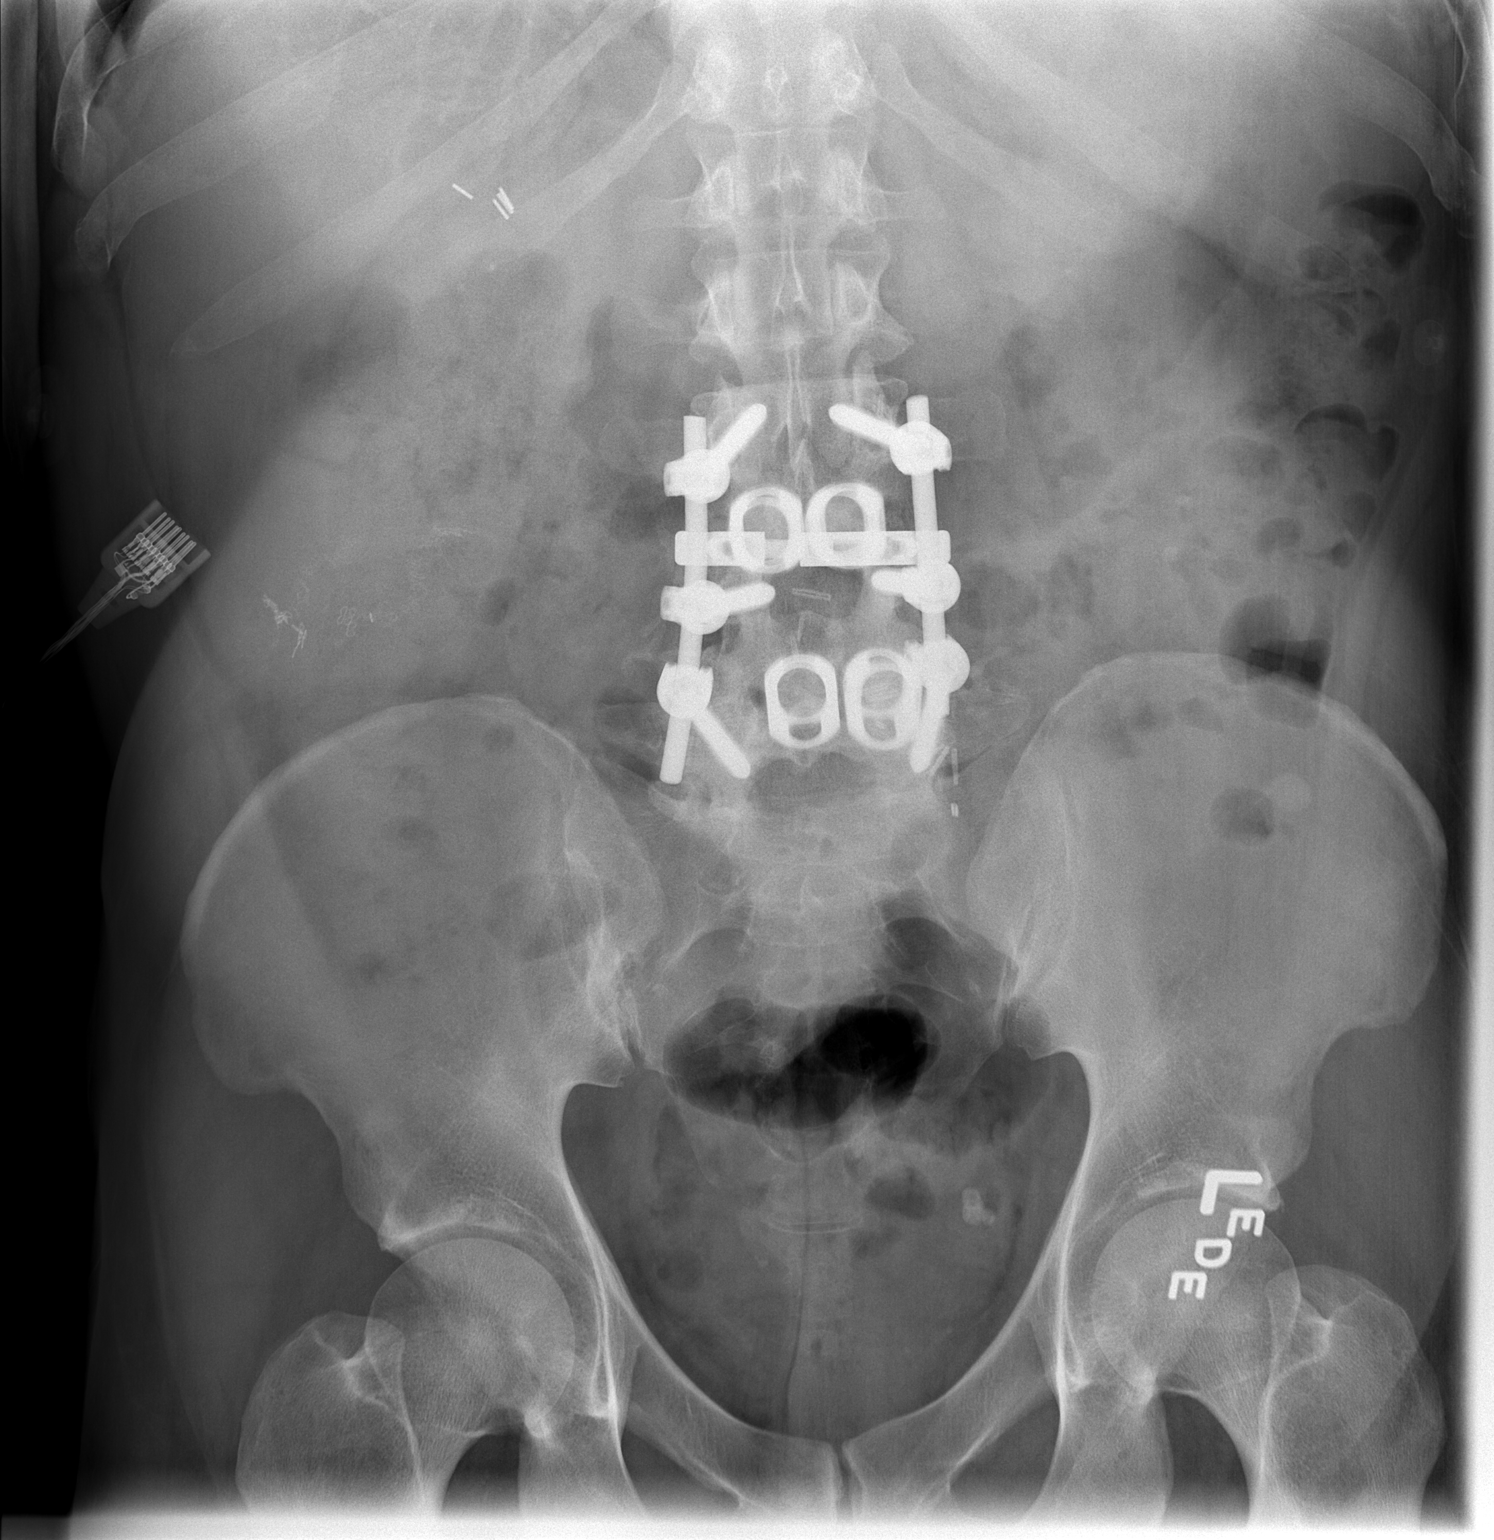

[2 of 2 positions shown; findings below may reference images not displayed]

FINDINGS: Upright and supine views of the abdomen were obtained.
No evidence to suggest free air.  The patient is status post fusion
of L3-L5.  Nonspecific bowel gas pattern with small amount of gas
in the small and large bowel.  There are no acute bony
abnormalities.  Round dense structure overlying the left iliac wing
is probably related to a diverticula or bowel structure. There are
surgical bowel changes in the right abdomen.
IMPRESSION: Nonspecific bowel gas pattern.

## 2010-11-13 IMAGING — CR DG LUMBAR SPINE COMPLETE 4+V
5 series · 5 of 5 positions shown · non-contrast
Comparison: CT abdomen and pelvis 01/15/2009 reviewed.

CLINICAL DATA: Back pain radiating into both legs.

LUMBAR SPINE - COMPLETE 4+ VIEW

[t l-spine a.p.]
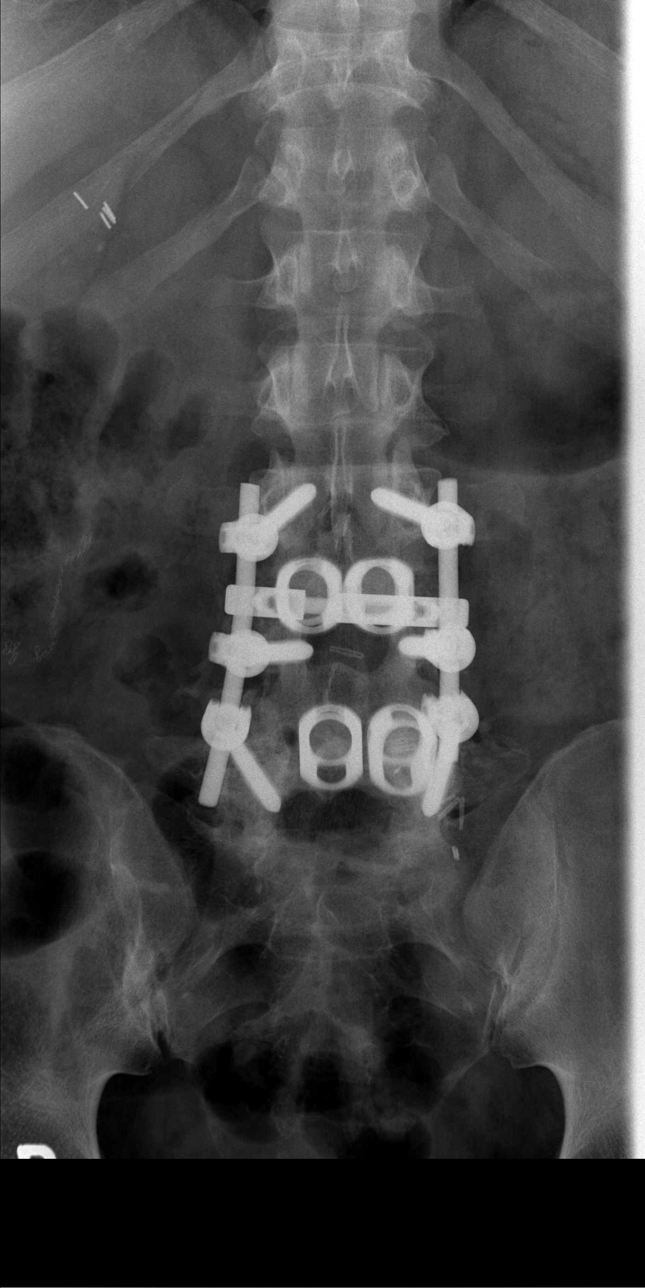

[t l-spine oblique exposure]
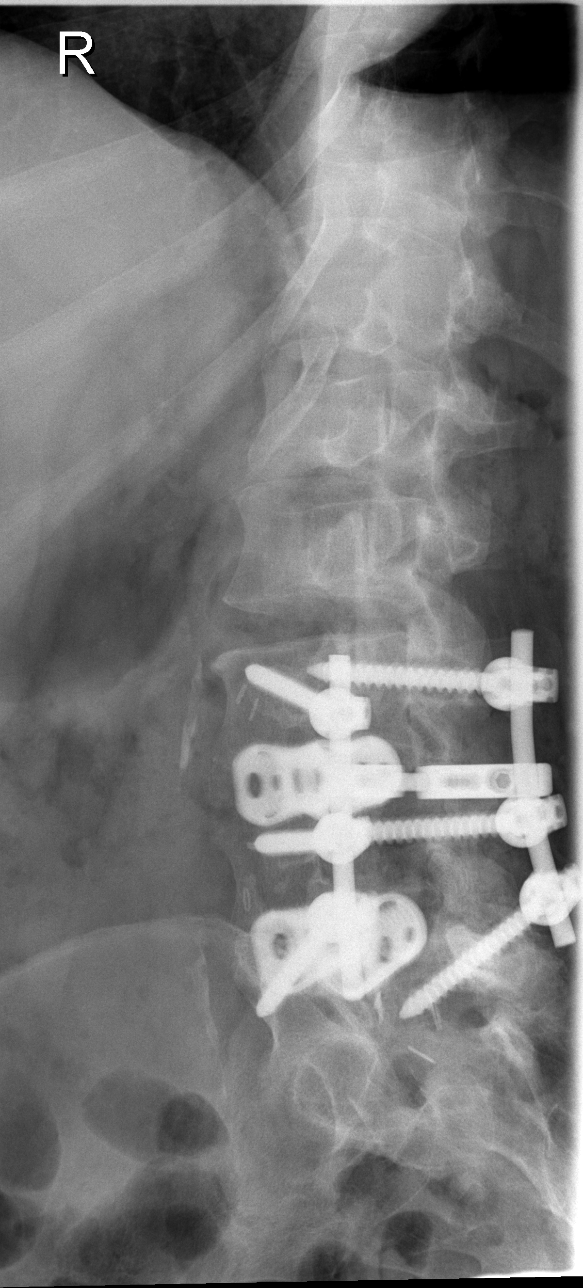

[t l-spine oblique exposure *]
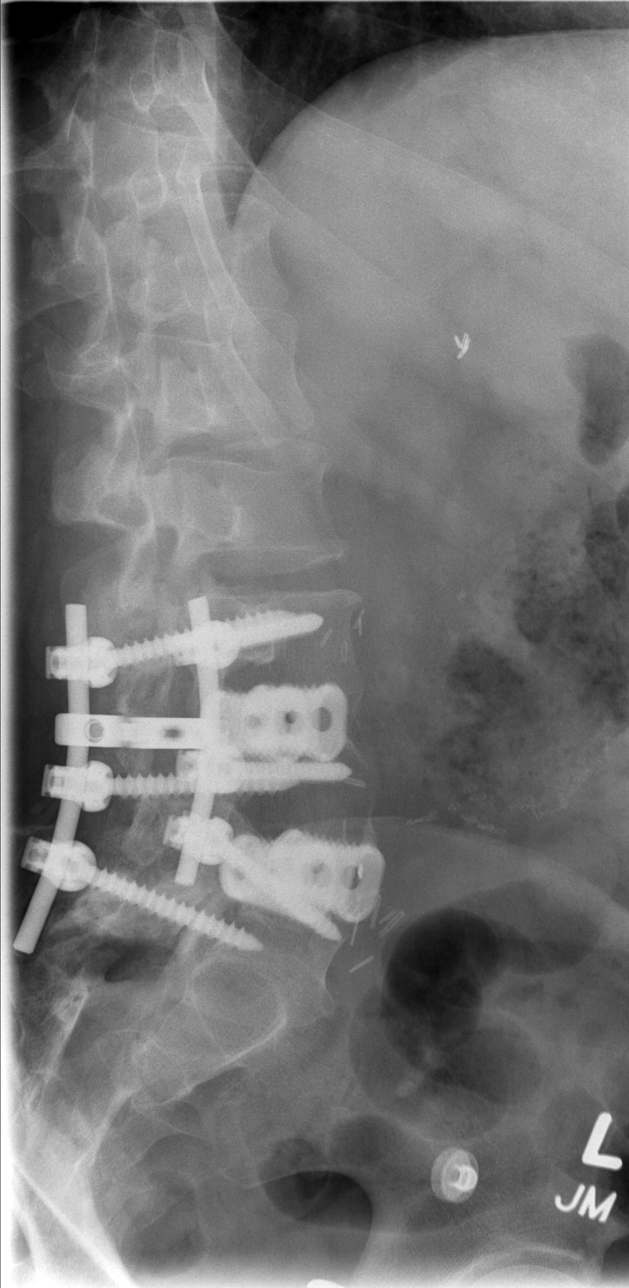

[t l-spine lat]
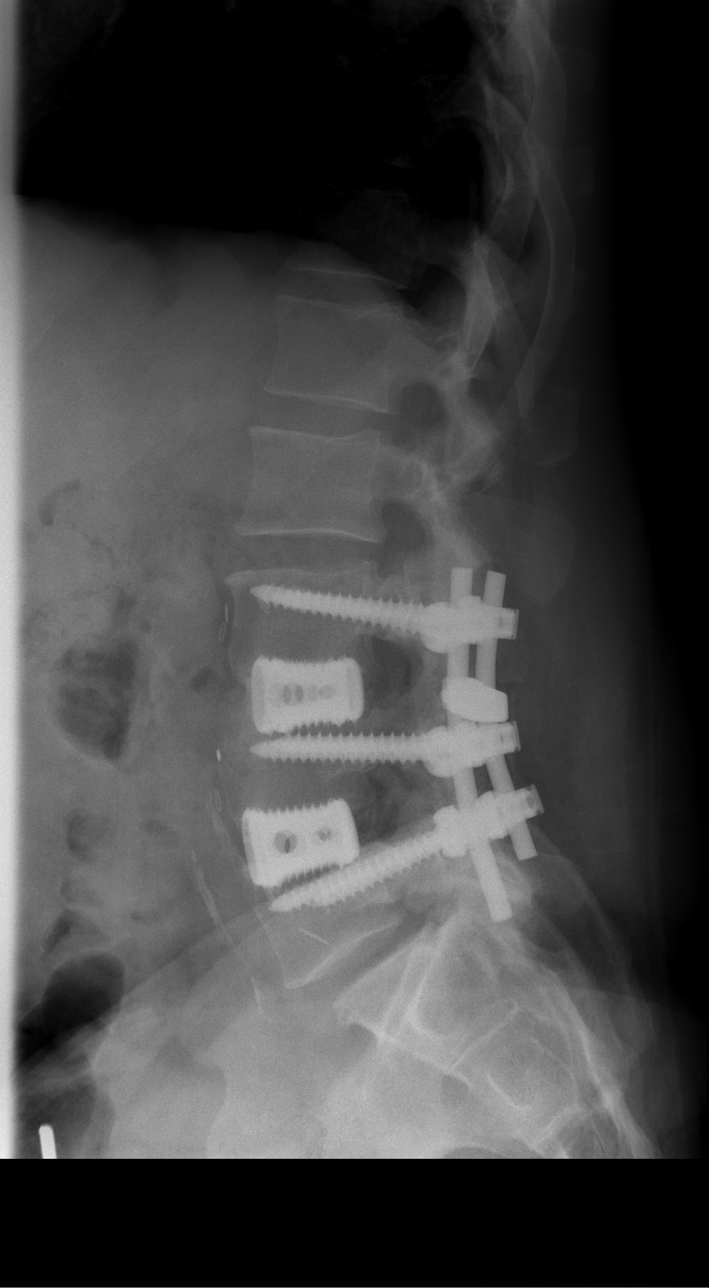

[t l-spine l5-s1 spot *]
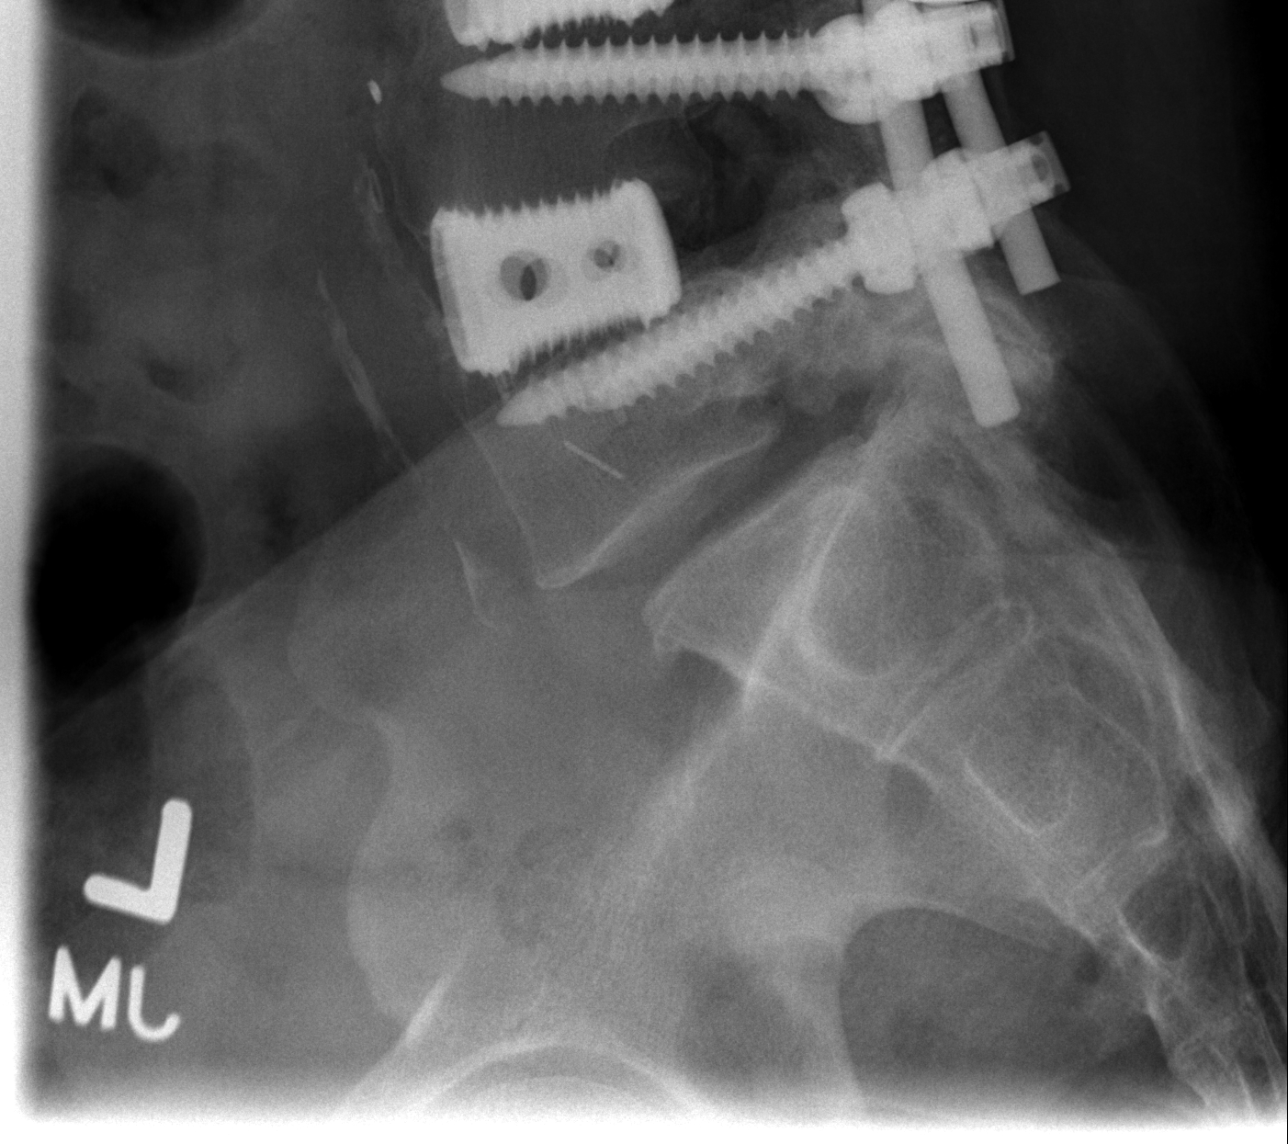

[5 of 5 positions shown; findings below may reference images not displayed]

FINDINGS: The patient there is status post L3-5 PLIF with pedicle
screws, stabilization bars and interbody ray cages in place.
Hardware is intact.  Vertebral body height is maintained.  There is
unchanged anterolisthesis of L5 on S1 of 0.9 cm due to pars
interarticularis defects.  No fracture.
IMPRESSION: 1.  No acute finding.
2.  Bilateral L5 pars interarticularis defects with associated mild
anterolisthesis.
3.  Status post L3-5 fusion.

## 2014-08-10 ENCOUNTER — Emergency Department (HOSPITAL_COMMUNITY)
Admission: EM | Admit: 2014-08-10 | Discharge: 2014-08-11 | Disposition: A | Discharge status: Home / Self Care | Payer: Self-pay | Attending: Emergency Medicine | Admitting: Emergency Medicine

## 2014-08-10 ENCOUNTER — Encounter (HOSPITAL_COMMUNITY): Payer: Self-pay | Admitting: Emergency Medicine

## 2014-08-10 DIAGNOSIS — Z720 Tobacco use: Secondary | ICD-10-CM | POA: Insufficient documentation

## 2014-08-10 DIAGNOSIS — Z8719 Personal history of other diseases of the digestive system: Secondary | ICD-10-CM

## 2014-08-10 DIAGNOSIS — R319 Hematuria, unspecified: Secondary | ICD-10-CM

## 2014-08-10 DIAGNOSIS — G8929 Other chronic pain: Secondary | ICD-10-CM

## 2014-08-10 DIAGNOSIS — R109 Unspecified abdominal pain: Secondary | ICD-10-CM | POA: Insufficient documentation

## 2014-08-10 DIAGNOSIS — R112 Nausea with vomiting, unspecified: Secondary | ICD-10-CM | POA: Insufficient documentation

## 2014-08-10 DIAGNOSIS — Z950 Presence of cardiac pacemaker: Secondary | ICD-10-CM | POA: Insufficient documentation

## 2014-08-10 HISTORY — DX: Other chronic pancreatitis: K86.1

## 2014-08-10 HISTORY — DX: Presence of cardiac pacemaker: Z95.0

## 2014-08-10 HISTORY — DX: Unspecified cirrhosis of liver: K74.60

## 2014-08-10 LAB — COMPREHENSIVE METABOLIC PANEL
ALK PHOS: 84 U/L (ref 38–126)
ALT: 106 U/L — AB (ref 17–63)
AST: 117 U/L — ABNORMAL HIGH (ref 15–41)
Albumin: 3.6 g/dL (ref 3.5–5.0)
Anion gap: 10 (ref 5–15)
BILIRUBIN TOTAL: 0.5 mg/dL (ref 0.3–1.2)
BUN: 9 mg/dL (ref 6–20)
CHLORIDE: 109 mmol/L (ref 101–111)
CO2: 21 mmol/L — AB (ref 22–32)
Calcium: 9.2 mg/dL (ref 8.9–10.3)
Creatinine, Ser: 1.01 mg/dL (ref 0.61–1.24)
GLUCOSE: 105 mg/dL — AB (ref 65–99)
POTASSIUM: 3.7 mmol/L (ref 3.5–5.1)
SODIUM: 140 mmol/L (ref 135–145)
Total Protein: 7 g/dL (ref 6.5–8.1)

## 2014-08-10 LAB — URINALYSIS, ROUTINE W REFLEX MICROSCOPIC
BILIRUBIN URINE: NEGATIVE
GLUCOSE, UA: NEGATIVE mg/dL
KETONES UR: NEGATIVE mg/dL
LEUKOCYTES UA: NEGATIVE
Nitrite: NEGATIVE
PH: 6 (ref 5.0–8.0)
Protein, ur: NEGATIVE mg/dL
Specific Gravity, Urine: 1.016 (ref 1.005–1.030)
Urobilinogen, UA: 1 mg/dL (ref 0.0–1.0)

## 2014-08-10 LAB — CBC WITH DIFFERENTIAL/PLATELET
BASOS ABS: 0.1 10*3/uL (ref 0.0–0.1)
BASOS PCT: 1 % (ref 0–1)
EOS PCT: 11 % — AB (ref 0–5)
Eosinophils Absolute: 0.5 10*3/uL (ref 0.0–0.7)
HCT: 37.2 % — ABNORMAL LOW (ref 39.0–52.0)
Hemoglobin: 12.6 g/dL — ABNORMAL LOW (ref 13.0–17.0)
Lymphocytes Relative: 37 % (ref 12–46)
Lymphs Abs: 1.6 10*3/uL (ref 0.7–4.0)
MCH: 31.7 pg (ref 26.0–34.0)
MCHC: 33.9 g/dL (ref 30.0–36.0)
MCV: 93.5 fL (ref 78.0–100.0)
MONO ABS: 0.3 10*3/uL (ref 0.1–1.0)
Monocytes Relative: 8 % (ref 3–12)
Neutro Abs: 1.9 10*3/uL (ref 1.7–7.7)
Neutrophils Relative %: 43 % (ref 43–77)
Platelets: 125 10*3/uL — ABNORMAL LOW (ref 150–400)
RBC: 3.98 MIL/uL — ABNORMAL LOW (ref 4.22–5.81)
RDW: 14.2 % (ref 11.5–15.5)
WBC: 4.4 10*3/uL (ref 4.0–10.5)

## 2014-08-10 LAB — LIPASE, BLOOD: Lipase: 113 U/L — ABNORMAL HIGH (ref 22–51)

## 2014-08-10 LAB — URINE MICROSCOPIC-ADD ON

## 2014-08-10 LAB — I-STAT TROPONIN, ED: TROPONIN I, POC: 0 ng/mL (ref 0.00–0.08)

## 2014-08-10 NOTE — ED Notes (Signed)
Pt from home for eval of generalized abd pain that started today, pt with hx of chronic pancreatitis and cirrhosis of the liver. Pt reports n/v but denies any diarrhea and reports low grade fever. Pt does reports hematuria, nad noted.

## 2014-08-10 NOTE — ED Notes (Signed)
Pt ambulatory to room.

## 2014-08-10 NOTE — ED Provider Notes (Signed)
CSN: [id number]     Arrival date & time 08/10/14  2218 History  This chart was scribed for Shon Batonourtney F Alexandru Moorer, MD by Roxy Cedarhandni Bhalodia, ED Scribe. This patient was seen in room D33C/D33C and the patient's care was started at 12:00 AM.   Chief Complaint  Patient presents with  . Abdominal Pain   Patient is a 49 y.o. male presenting with abdominal pain. The history is provided by the patient. No language interpreter was used.  Abdominal Pain Associated symptoms: nausea and vomiting   Associated symptoms: no chest pain, no constipation, no diarrhea, no dysuria, no fever and no shortness of breath    HPI Comments: Arthur Livingston is a 49 y.o. male with a PMHx of chronic pancreatitis, cholecystectomy, and cirrhosis, who presents to the Emergency Department complaining of moderate abdominal pain onset 1 week ago with pain radiating to bilateral flank areas. He currently rates his pain to be 10/10. He currently reports flare up episode of pancreatitis with associated nausea, vomiting and subjective fever. Reports normal BM.  He denies associated diarrhea. He reports hx of drinking alcohol but denies intake for the past 4 years. Patient is a current smoker and smokes 0.5 ppd. No meds taken daily. Patient reports allergies to Toradol.  Past Medical History  Diagnosis Date  . Chronic pancreatitis   . Cirrhosis   . Pacemaker    History reviewed. No pertinent past surgical history. History reviewed. No pertinent family history. History  Substance Use Topics  . Smoking status: Current Every Day Smoker -- 0.50 packs/day    Types: Cigarettes  . Smokeless tobacco: Not on file  . Alcohol Use: No   Review of Systems  Constitutional: Negative.  Negative for fever.  Respiratory: Negative.  Negative for chest tightness and shortness of breath.   Cardiovascular: Negative.  Negative for chest pain.  Gastrointestinal: Positive for nausea, vomiting and abdominal pain. Negative for diarrhea and constipation.   Genitourinary: Negative.  Negative for dysuria.  Musculoskeletal: Negative for back pain.  All other systems reviewed and are negative.  Allergies  Phenergan and Toradol  Home Medications   Prior to Admission medications   Medication Sig Start Date End Date Taking? Authorizing Provider  ondansetron (ZOFRAN ODT) 4 MG disintegrating tablet Take 1 tablet (4 mg total) by mouth every 8 (eight) hours as needed for nausea or vomiting. 08/11/14   Shon Batonourtney F Rynlee Lisbon, MD  sucralfate (CARAFATE) 1 G tablet Take 1 tablet (1 g total) by mouth 4 (four) times daily -  with meals and at bedtime. 08/11/14   Shon Batonourtney F Tniyah Nakagawa, MD   Triage Vitals: BP 138/79 mmHg  Pulse 86  Temp(Src) 99.4 F (37.4 C) (Oral)  Resp 16  Ht 6' (1.829 m)  Wt 167 lb (75.751 kg)  BMI 22.64 kg/m2  SpO2 97%  Physical Exam  Constitutional: He is oriented to person, place, and time. No distress.  Disheveled appearing  HENT:  Head: Normocephalic and atraumatic.  Eyes: Pupils are equal, round, and reactive to light.  Cardiovascular: Normal rate, regular rhythm and normal heart sounds.   No murmur heard. Pulmonary/Chest: Effort normal and breath sounds normal. No respiratory distress. He has no wheezes.  Abdominal: Soft. Bowel sounds are normal. There is no tenderness. There is no rebound and no guarding.  Mildly distended  Musculoskeletal: He exhibits no edema.  Neurological: He is alert and oriented to person, place, and time.  Skin: Skin is warm and dry.  Psychiatric: He has a normal mood and affect.  Nursing note and vitals reviewed.   ED Course  Procedures (including critical care time)  DIAGNOSTIC STUDIES: Oxygen Saturation is 97% on RA, normal by my interpretation.    COORDINATION OF CARE: 12:06 AM- Discussed plans to order diagnostic lab work and urinalysis. Pt advised of plan for treatment and pt agrees.  Labs Review Labs Reviewed  CBC WITH DIFFERENTIAL/PLATELET - Abnormal; Notable for the following:     RBC 3.98 (*)    Hemoglobin 12.6 (*)    HCT 37.2 (*)    Platelets 125 (*)    Eosinophils Relative 11 (*)    All other components within normal limits  COMPREHENSIVE METABOLIC PANEL - Abnormal; Notable for the following:    CO2 21 (*)    Glucose, Bld 105 (*)    AST 117 (*)    ALT 106 (*)    All other components within normal limits  LIPASE, BLOOD - Abnormal; Notable for the following:    Lipase 113 (*)    All other components within normal limits  URINALYSIS, ROUTINE W REFLEX MICROSCOPIC - Abnormal; Notable for the following:    APPearance HAZY (*)    Hgb urine dipstick LARGE (*)    All other components within normal limits  URINE MICROSCOPIC-ADD ON - Abnormal; Notable for the following:    Bacteria, UA FEW (*)    All other components within normal limits  I-STAT TROPOININ, ED   Imaging Review No results found.   EKG Interpretation None     MDM   Final diagnoses:  Chronic abdominal pain  History of pancreatitis  Painless hematuria    Patient presents with abdominal pain. Abdominal pain is consistent with prior episodes of pancreatitis. Nontoxic on exam. Vital signs are reassuring. Patient afebrile. Abdomen is mildly distended but nontender.  Lab work notable for lipase of 113 which is minimally elevated but not within range of acute pancreatitis. Patient also noted to have hemoglobin in urine. Denies any back or flank pain. Patient given pain and nausea medication as well as fluids. On recheck, patient is eating soup at the bedside and tolerating well. No vomiting. Patient will be discharged home with Carafate. Low suspicion at this time for other intra-abdominal pathology including obstruction. Regarding patient's hematuria, he is a smoker. He will be given urology follow-up for further evaluation if this persists.  After history, exam, and medical workup I feel the patient has been appropriately medically screened and is safe for discharge home. Pertinent diagnoses were  discussed with the patient. Patient was given return precautions.  I personally performed the services described in this documentation, which was scribed in my presence. The recorded information has been reviewed and is accurate.   Shon Batonourtney F Trei Schoch, MD 08/11/14 [phone]

## 2014-08-11 ENCOUNTER — Emergency Department (HOSPITAL_COMMUNITY)
Admission: EM | Admit: 2014-08-11 | Discharge: 2014-08-11 | Disposition: A | Discharge status: Home / Self Care | Payer: Self-pay | Attending: Emergency Medicine | Admitting: Emergency Medicine

## 2014-08-11 ENCOUNTER — Encounter (HOSPITAL_COMMUNITY): Payer: Self-pay

## 2014-08-11 DIAGNOSIS — D649 Anemia, unspecified: Secondary | ICD-10-CM

## 2014-08-11 DIAGNOSIS — R1010 Upper abdominal pain, unspecified: Secondary | ICD-10-CM

## 2014-08-11 DIAGNOSIS — Z720 Tobacco use: Secondary | ICD-10-CM | POA: Insufficient documentation

## 2014-08-11 DIAGNOSIS — Z950 Presence of cardiac pacemaker: Secondary | ICD-10-CM | POA: Insufficient documentation

## 2014-08-11 DIAGNOSIS — K860 Alcohol-induced chronic pancreatitis: Secondary | ICD-10-CM

## 2014-08-11 LAB — URINALYSIS, ROUTINE W REFLEX MICROSCOPIC
Bilirubin Urine: NEGATIVE
GLUCOSE, UA: NEGATIVE mg/dL
Hgb urine dipstick: NEGATIVE
KETONES UR: NEGATIVE mg/dL
LEUKOCYTES UA: NEGATIVE
Nitrite: NEGATIVE
PH: 6 (ref 5.0–8.0)
Protein, ur: NEGATIVE mg/dL
SPECIFIC GRAVITY, URINE: 1.005 (ref 1.005–1.030)
UROBILINOGEN UA: 1 mg/dL (ref 0.0–1.0)

## 2014-08-11 LAB — COMPREHENSIVE METABOLIC PANEL
ALBUMIN: 3.5 g/dL (ref 3.5–5.0)
ALT: 110 U/L — AB (ref 17–63)
ANION GAP: 12 (ref 5–15)
AST: 128 U/L — ABNORMAL HIGH (ref 15–41)
Alkaline Phosphatase: 82 U/L (ref 38–126)
BUN: 7 mg/dL (ref 6–20)
CALCIUM: 9 mg/dL (ref 8.9–10.3)
CO2: 20 mmol/L — ABNORMAL LOW (ref 22–32)
Chloride: 107 mmol/L (ref 101–111)
Creatinine, Ser: 0.98 mg/dL (ref 0.61–1.24)
GLUCOSE: 92 mg/dL (ref 65–99)
POTASSIUM: 3.3 mmol/L — AB (ref 3.5–5.1)
Sodium: 139 mmol/L (ref 135–145)
TOTAL PROTEIN: 7.2 g/dL (ref 6.5–8.1)
Total Bilirubin: 0.6 mg/dL (ref 0.3–1.2)

## 2014-08-11 LAB — CBC WITH DIFFERENTIAL/PLATELET
BASOS ABS: 0.1 10*3/uL (ref 0.0–0.1)
Basophils Relative: 1 % (ref 0–1)
EOS ABS: 0.7 10*3/uL (ref 0.0–0.7)
Eosinophils Relative: 12 % — ABNORMAL HIGH (ref 0–5)
HEMATOCRIT: 36.4 % — AB (ref 39.0–52.0)
HEMOGLOBIN: 12.4 g/dL — AB (ref 13.0–17.0)
LYMPHS ABS: 2.2 10*3/uL (ref 0.7–4.0)
LYMPHS PCT: 38 % (ref 12–46)
MCH: 31.7 pg (ref 26.0–34.0)
MCHC: 34.1 g/dL (ref 30.0–36.0)
MCV: 93.1 fL (ref 78.0–100.0)
MONOS PCT: 6 % (ref 3–12)
Monocytes Absolute: 0.4 10*3/uL (ref 0.1–1.0)
NEUTROS ABS: 2.6 10*3/uL (ref 1.7–7.7)
Neutrophils Relative %: 43 % (ref 43–77)
PLATELETS: 122 10*3/uL — AB (ref 150–400)
RBC: 3.91 MIL/uL — AB (ref 4.22–5.81)
RDW: 14.4 % (ref 11.5–15.5)
WBC: 5.8 10*3/uL (ref 4.0–10.5)

## 2014-08-11 LAB — LIPASE, BLOOD: LIPASE: 93 U/L — AB (ref 22–51)

## 2014-08-11 MED ORDER — GI COCKTAIL ~~LOC~~
30.0000 mL | Freq: Once | ORAL | Status: AC
  Administered 2014-08-11: 30 mL via ORAL
  Filled 2014-08-11: qty 30

## 2014-08-11 MED ORDER — SODIUM CHLORIDE 0.9 % IV BOLUS (SEPSIS)
1000.0000 mL | Freq: Once | INTRAVENOUS | Status: AC
  Administered 2014-08-11: 1000 mL via INTRAVENOUS

## 2014-08-11 MED ORDER — MORPHINE SULFATE 4 MG/ML IJ SOLN
4.0000 mg | Freq: Once | INTRAMUSCULAR | Status: AC
  Administered 2014-08-11: 4 mg via INTRAVENOUS
  Filled 2014-08-11: qty 1

## 2014-08-11 MED ORDER — SUCRALFATE 1 G PO TABS: 1.0000 g | ORAL_TABLET | Freq: Three times a day (TID) | ORAL | Status: AC

## 2014-08-11 MED ORDER — SUCRALFATE 1 G PO TABS
1.0000 g | ORAL_TABLET | Freq: Once | ORAL | Status: AC
  Administered 2014-08-11: 1 g via ORAL
  Filled 2014-08-11: qty 1

## 2014-08-11 MED ORDER — ONDANSETRON 4 MG PO TBDP: 4.0000 mg | ORAL_TABLET | Freq: Three times a day (TID) | ORAL | Status: AC | PRN

## 2014-08-11 MED ORDER — HYDROMORPHONE HCL 1 MG/ML IJ SOLN
1.0000 mg | Freq: Once | INTRAMUSCULAR | Status: AC
  Administered 2014-08-11: 1 mg via INTRAVENOUS
  Filled 2014-08-11: qty 1

## 2014-08-11 MED ORDER — ONDANSETRON HCL 4 MG/2ML IJ SOLN
4.0000 mg | Freq: Once | INTRAMUSCULAR | Status: AC
  Administered 2014-08-11: 4 mg via INTRAVENOUS
  Filled 2014-08-11: qty 2

## 2014-08-11 NOTE — Discharge Instructions (Signed)
You presented today with abdominal pain. He has a history of chronic pancreatitis picture labwork is reassuring.  He'll be given carefully at home. You should avoid drinking. If you have any new or worsening symptoms she should return for further management.  Abdominal Pain Many things can cause abdominal pain. Usually, abdominal pain is not caused by a disease and will improve without treatment. It can often be observed and treated at home. Your health care provider will do a physical exam and possibly order blood tests and X-rays to help determine the seriousness of your pain. However, in many cases, more time must pass before a clear cause of the pain can be found. Before that point, your health care provider may not know if you need more testing or further treatment. HOME CARE INSTRUCTIONS  Monitor your abdominal pain for any changes. The following actions may help to alleviate any discomfort you are experiencing:  Only take over-the-counter or prescription medicines as directed by your health care provider.  Do not take laxatives unless directed to do so by your health care provider.  Try a clear liquid diet (broth, tea, or water) as directed by your health care provider. Slowly move to a bland diet as tolerated. SEEK MEDICAL CARE IF:  You have unexplained abdominal pain.  You have abdominal pain associated with nausea or diarrhea.  You have pain when you urinate or have a bowel movement.  You experience abdominal pain that wakes you in the night.  You have abdominal pain that is worsened or improved by eating food.  You have abdominal pain that is worsened with eating fatty foods.  You have a fever. SEEK IMMEDIATE MEDICAL CARE IF:   Your pain does not go away within 2 hours.  You keep throwing up (vomiting).  Your pain is felt only in portions of the abdomen, such as the right side or the left lower portion of the abdomen.  You pass bloody or black tarry stools. MAKE SURE  YOU:  Understand these instructions.   Will watch your condition.   Will get help right away if you are not doing well or get worse.  Document Released: 12/17/2004 Document Revised: 03/14/2013 Document Reviewed: 11/16/2012 [facility] Patient Information 2015 [place], [place]. This information is not intended to replace advice given to you by your health care provider. Make sure you discuss any questions you have with your health care provider.

## 2014-08-11 NOTE — ED Provider Notes (Signed)
CSN: [id number]     Arrival date & time 08/11/14  1857 History   First MD Initiated Contact with Patient 08/11/14 1911     Chief Complaint  Patient presents with  . Abdominal Pain     (Consider location/radiation/quality/duration/timing/severity/associated sxs/prior Treatment) HPI Comments: Arthur Livingston is a 49 y.o. male with a PMHx of chronic pancreatitis, cholecystectomy, and cirrhosis, who presents to the ED with complaints of chronic abdominal pain that worsened one week ago, for which he was seen in the ER last night, but then drank one mixed drink today causing it to again worsen. He states the pain is 10/10, located in the epigastrium, radiating to bilateral flanks, stabbing in nature, constant, worse with movement and eating, and with no known alleviating factors given that he has not tried any medications prior to arrival including not having filled the Carafate prescription he was given last night. He denies any fevers, chills, chest pain, shortness breath, nausea, vomiting, diarrhea, constipation, obstipation, melena, hematochezia, dysuria, hematuria, penile discharge, testicular pain or swelling, numbness, tingling, weakness, suspicious food intake, recent travel, antibiotic use, NSAIDs, or sick contacts. He states this feels exactly like his pancreatitis.  Patient is a 49 y.o. male presenting with abdominal pain. The history is provided by the patient. No language interpreter was used.  Abdominal Pain Pain location:  Epigastric Pain quality: stabbing   Pain radiates to:  R flank and L flank Pain severity:  Moderate Onset quality:  Gradual Duration:  1 week Timing:  Constant Progression:  Worsening Chronicity:  Chronic Context: alcohol use   Context: not recent travel, not sick contacts and not suspicious food intake   Relieved by:  None tried Worsened by:  Eating and movement Ineffective treatments:  None tried Associated symptoms: no chest pain, no chills, no constipation, no  diarrhea, no dysuria, no fever, no flatus, no hematemesis, no hematochezia, no hematuria, no melena, no nausea, no shortness of breath and no vomiting   Risk factors: alcohol abuse     Past Medical History  Diagnosis Date  . Chronic pancreatitis   . Cirrhosis   . Pacemaker    History reviewed. No pertinent past surgical history. No family history on file. History  Substance Use Topics  . Smoking status: Current Every Day Smoker -- 0.50 packs/day    Types: Cigarettes  . Smokeless tobacco: Not on file  . Alcohol Use: No    Review of Systems  Constitutional: Negative for fever and chills.  Respiratory: Negative for shortness of breath.   Cardiovascular: Negative for chest pain.  Gastrointestinal: Positive for abdominal pain. Negative for nausea, vomiting, diarrhea, constipation, blood in stool, melena, hematochezia, flatus and hematemesis.  Genitourinary: Positive for flank pain (radiating from epigastrum). Negative for dysuria, hematuria, discharge, scrotal swelling and testicular pain.  Musculoskeletal: Negative for myalgias and arthralgias.  Skin: Negative for rash.  Allergic/Immunologic: Negative for immunocompromised state.  Neurological: Negative for weakness and numbness.  Psychiatric/Behavioral: Negative for confusion.   10 Systems reviewed and are negative for acute change except as noted in the HPI.    Allergies  Phenergan and Toradol  Home Medications   Prior to Admission medications   Medication Sig Start Date End Date Taking? Authorizing Provider  ondansetron (ZOFRAN ODT) 4 MG disintegrating tablet Take 1 tablet (4 mg total) by mouth every 8 (eight) hours as needed for nausea or vomiting. 08/11/14   Shon Baton, MD  sucralfate (CARAFATE) 1 G tablet Take 1 tablet (1 g total) by mouth 4 (  four) times daily -  with meals and at bedtime. 08/11/14   Shon Baton, MD   BP 136/84 mmHg  Pulse 86  Temp(Src) 99.4 F (37.4 C) (Oral)  Resp 18  SpO2  94% Physical Exam  Constitutional: He is oriented to person, place, and time. Vital signs are normal. He appears well-developed and well-nourished.  Non-toxic appearance. No distress.  Afebrile, nontoxic, NAD  HENT:  Head: Normocephalic and atraumatic.  Mouth/Throat: Oropharynx is clear and moist and mucous membranes are normal.  Eyes: Conjunctivae and EOM are normal. Right eye exhibits no discharge. Left eye exhibits no discharge.  Neck: Normal range of motion. Neck supple.  Cardiovascular: Normal rate, regular rhythm, normal heart sounds and intact distal pulses.  Exam reveals no gallop and no friction rub.   No murmur heard. Pulmonary/Chest: Effort normal and breath sounds normal. No respiratory distress. He has no decreased breath sounds. He has no wheezes. He has no rhonchi. He has no rales.  Abdominal: Soft. Normal appearance and bowel sounds are normal. He exhibits distension (mild). There is generalized tenderness (but no evidence of tenderness when distracted). There is no rigidity, no rebound, no guarding, no CVA tenderness, no tenderness at McBurney's point and negative Murphy's sign.  Soft, with very mild distension/truncal obesity, +BS throughout, with diffuse tenderness but no evidence of tenderness when pt is distracted, no r/g/r, neg murphy's, neg mcburney's, no CVA TTP   Musculoskeletal: Normal range of motion.  Neurological: He is alert and oriented to person, place, and time. He has normal strength. No sensory deficit.  Skin: Skin is warm, dry and intact. No rash noted.  Psychiatric: He has a normal mood and affect.  Nursing note and vitals reviewed.   ED Course  Procedures (including critical care time) Labs Review Labs Reviewed  CBC WITH DIFFERENTIAL/PLATELET - Abnormal; Notable for the following:    RBC 3.91 (*)    Hemoglobin 12.4 (*)    HCT 36.4 (*)    Platelets 122 (*)    Eosinophils Relative 12 (*)    All other components within normal limits  COMPREHENSIVE  METABOLIC PANEL - Abnormal; Notable for the following:    Potassium 3.3 (*)    CO2 20 (*)    AST 128 (*)    ALT 110 (*)    All other components within normal limits  LIPASE, BLOOD - Abnormal; Notable for the following:    Lipase 93 (*)    All other components within normal limits  URINALYSIS, ROUTINE W REFLEX MICROSCOPIC    Imaging Review No results found.   EKG Interpretation None      MDM   Final diagnoses:  Pain of upper abdomen  Chronic anemia  Alcohol-induced chronic pancreatitis    49 y.o. male here with chronic abd pain, worse over the last one week, and worse today after drinking 1 mixed drink. On exam, pt has diffuse tenderness but when distracted does not seem to have evidence of tenderness, no rebound or guarding, mild distension similar to truncal obesity from alcholism. No flank tenderness. Labs last night showed AST/ALT elevated in alcoholic pattern, with lipase elevated but not to the acute pancreatitis level. Trop neg. Will give GI cocktail, fluids, and pain meds. Labs already in process from triage, will evaluate for any changes from last night. Will re-collect U/A since last night there was some bacteria but could be contaminated catch. Doubt need for imaging. Will reassess shortly.   8:38 PM CBC w/diff unchanged from last night,  still with mild chronic anemia. CMP unchanged with AST/ALT still elevated in alcoholic pattern. Lipase improved down to 93 from 113 which is reassuring. U/A clear today, confirming that last night's sample was contaminated/concentrated. Doubt emergent process going on, [facility] chronic [facility] pain. Urged pt to stop drinking. Urged him to fill his carafate script from last night. I explained the diagnosis and have given explicit precautions to return to the ER including for any other new or worsening symptoms. The patient understands and accepts the medical plan as it's been dictated and I have  answered their questions. Discharge instructions concerning home care and prescriptions have been given. The patient is STABLE and is discharged to home in good condition.  BP 141/87 mmHg  Pulse 70  Temp(Src) 99.4 F (37.4 C) (Oral)  Resp 12  SpO2 99%   Meds ordered this encounter  Medications  . sodium chloride 0.9 % bolus 1,000 mL    Sig:   . morphine 4 MG/ML injection 4 mg    Sig:   . gi cocktail (Maalox,Lidocaine,Donnatal)    Sig:      Arthur Paschal Camprubi-Soms, PA-C 08/11/14 2048  Arthur Munchobert Lockwood, MD 08/11/14 2124

## 2014-08-11 NOTE — Discharge Instructions (Signed)
Your abdominal pain was likely due to the alcohol you consumed. You MUST stop drinking. Use the carafate prescription given to you last night to help with pain. Stay well hydrated. Follow up with [facility] for ongoing medical care and recheck of symptoms in 1 week. Return to the ER for changes or worsening symptoms.  Abdominal (belly) pain can be caused by many things. Your caregiver performed an examination and possibly ordered blood/urine tests and imaging (CT scan, x-rays, ultrasound). Many cases can be observed and treated at home after initial evaluation in the emergency department. Even though you are being discharged home, abdominal pain can be unpredictable. Therefore, you need a repeated exam if your pain does not resolve, returns, or worsens. Most patients with abdominal pain don't have to be admitted to the hospital or have surgery, but serious problems like appendicitis and gallbladder attacks can start out as nonspecific pain. Many abdominal conditions cannot be diagnosed in one visit, so follow-up evaluations are very important. SEEK IMMEDIATE MEDICAL ATTENTION IF YOU DEVELOP ANY OF THE FOLLOWING SYMPTOMS:  The pain does not go away or becomes severe.   A temperature above 101 develops.   Repeated vomiting occurs (multiple episodes).   The pain becomes localized to portions of the abdomen. The right side could possibly be appendicitis. In an adult, the left lower portion of the abdomen could be colitis or diverticulitis.   Blood is being passed in stools or vomit (bright red or black tarry stools).   Return also if you develop chest pain, difficulty breathing, dizziness or fainting, or become confused, poorly responsive, or inconsolable (young children).  The constipation stays for more than 4 days.   There is belly (abdominal) or rectal pain.   You do not seem to be getting better.     Abdominal Pain Many things can cause belly (abdominal) pain. Most times, the  belly pain is not dangerous. Many cases of belly pain can be watched and treated at home. HOME CARE   Do not take medicines that help you go poop (laxatives) unless told to by your doctor.  Only take medicine as told by your doctor.  Eat or drink as told by your doctor. Your doctor will tell you if you should be on a special diet. GET HELP IF:  You do not know what is causing your belly pain.  You have belly pain while you are sick to your stomach (nauseous) or have runny poop (diarrhea).  You have pain while you pee or poop.  Your belly pain wakes you up at night.  You have belly pain that gets worse or better when you eat.  You have belly pain that gets worse when you eat fatty foods.  You have a fever. GET HELP RIGHT AWAY IF:   The pain does not go away within 2 hours.  You keep throwing up (vomiting).  The pain changes and is only in the right or left part of the belly.  You have bloody or tarry looking poop. MAKE SURE YOU:   Understand these instructions.  Will watch your condition.  Will get help right away if you are not doing well or get worse. Document Released: 08/26/2007 Document Revised: 03/14/2013 Document Reviewed: 11/16/2012 [facility] Patient Information 2015 [place], [place]. This information is not intended to replace advice given to you by your health care provider. Make sure you discuss any questions you have with your health care provider.  Low-Fat Diet for Pancreatitis or Gallbladder Conditions  A low-fat diet can be helpful if you have pancreatitis or a gallbladder condition. With these conditions, your pancreas and gallbladder have trouble digesting fats. A healthy eating plan with less fat will help rest your pancreas and gallbladder and reduce your symptoms. WHAT DO I NEED TO KNOW ABOUT THIS DIET?  Eat a low-fat diet.  Reduce your fat intake to less than 20-30% of your total daily calories. This is less than 50-60 g of fat per day.  Remember  that you need some fat in your diet. Ask your dietician what your daily goal should be.  Choose nonfat and low-fat healthy foods. Look for the words "nonfat," "low fat," or "fat free."  As a guide, look on the label and choose foods with less than 3 g of fat per serving. Eat only one serving.  Avoid alcohol.  Do not smoke. If you need help quitting, talk with your health care provider.  Eat small frequent meals instead of three large heavy meals. WHAT FOODS CAN I EAT? Grains Include healthy grains and starches such as potatoes, wheat bread, fiber-rich cereal, and brown rice. Choose whole grain options whenever possible. In adults, whole grains should account for 45-65% of your daily calories.  Fruits and Vegetables Eat plenty of fruits and vegetables. Fresh fruits and vegetables add fiber to your diet. Meats and Other Protein Sources Eat lean meat such as chicken and pork. Trim any fat off of meat before cooking it. Eggs, fish, and beans are other sources of protein. In adults, these foods should account for 10-35% of your daily calories. Dairy Choose low-fat milk and dairy options. Dairy includes fat and protein, as well as calcium.  Fats and Oils Limit high-fat foods such as fried foods, sweets, baked goods, sugary drinks.  Other Creamy sauces and condiments, such as mayonnaise, can add extra fat. Think about whether or not you need to use them, or use smaller amounts or low fat options. WHAT FOODS ARE NOT RECOMMENDED?  High fat foods, such as:  [employer].  Ice cream.  [place] toast.  Sweet rolls.  Pizza.  Cheese bread.  Foods covered with batter, butter, creamy sauces, or cheese.  Fried foods.  Sugary drinks and desserts.  Foods that cause gas or bloating Document Released: 03/14/2013 Document Reviewed: 03/14/2013 [facility] Patient Information 2015 [place], [place]. This information is not intended to replace advice given to you by your health care provider. Make  sure you discuss any questions you have with your health care provider.  Anemia, Nonspecific Anemia is a condition in which the concentration of red blood cells or hemoglobin in the blood is below normal. Hemoglobin is a substance in red blood cells that carries oxygen to the tissues of the body. Anemia results in not enough oxygen reaching these tissues.  CAUSES  Common causes of anemia include:   Excessive bleeding. Bleeding may be internal or external. This includes excessive bleeding from periods (in women) or from the intestine.   Poor nutrition.   Chronic kidney, thyroid, and liver disease.  Bone marrow disorders that decrease red blood cell production.  Cancer and treatments for cancer.  HIV, AIDS, and their treatments.  Spleen problems that increase red blood cell destruction.  Blood disorders.  Excess destruction of red blood cells due to infection, medicines, and autoimmune disorders. SIGNS AND SYMPTOMS   Minor weakness.   Dizziness.   Headache.  Palpitations.   Shortness of breath, especially with exercise.   Paleness.  Cold sensitivity.  Indigestion.  Nausea.  Difficulty sleeping.  Difficulty concentrating. Symptoms may occur suddenly or they may develop slowly.  DIAGNOSIS  Additional blood tests are often needed. These help your health care provider determine the best treatment. Your health care provider will check your stool for blood and look for other causes of blood loss.  TREATMENT  Treatment varies depending on the cause of the anemia. Treatment can include:   Supplements of iron, vitamin B12, or folic acid.   Hormone medicines.   A blood transfusion. This may be needed if blood loss is severe.   Hospitalization. This may be needed if there is significant continual blood loss.   Dietary changes.  Spleen removal. HOME CARE INSTRUCTIONS Keep all follow-up appointments. It often takes many weeks to correct anemia, and having  your health care provider check on your condition and your response to treatment is very important. SEEK IMMEDIATE MEDICAL CARE IF:   You develop extreme weakness, shortness of breath, or chest pain.   You become dizzy or have trouble concentrating.  You develop heavy vaginal bleeding.   You develop a rash.   You have bloody or black, tarry stools.   You faint.   You vomit up blood.   You vomit repeatedly.   You have abdominal pain.  You have a fever or persistent symptoms for more than 2-3 days.   You have a fever and your symptoms suddenly get worse.   You are dehydrated.  MAKE SURE YOU:  Understand these instructions.  Will watch your condition.  Will get help right away if you are not doing well or get worse. Document Released: 04/16/2004 Document Revised: 11/09/2012 Document Reviewed: 09/02/2012 [facility] Patient Information 2015 [place], [place]. This information is not intended to replace advice given to you by your health care provider. Make sure you discuss any questions you have with your health care provider.

## 2014-08-11 NOTE — ED Notes (Signed)
Pt hasn't drank in 4 years, got mad at job issue and drank one drink then started having abd pain. Hx of cirrhosis and pancreatits. No vomiting or diarrhea.

## 2019-06-01 DIAGNOSIS — Z0289 Encounter for other administrative examinations: Secondary | ICD-10-CM
